# Patient Record
Sex: Female | Born: 1975 | Race: Black or African American | Hispanic: No | Smoking: Former smoker
Health system: Southern US, Community
[De-identification: ages and names within clinical notes are randomized; demographics above are authoritative.]

## PROBLEM LIST (undated history)

## (undated) DIAGNOSIS — R739 Hyperglycemia, unspecified: Secondary | ICD-10-CM

## (undated) DIAGNOSIS — R0789 Other chest pain: Secondary | ICD-10-CM

## (undated) DIAGNOSIS — K219 Gastro-esophageal reflux disease without esophagitis: Secondary | ICD-10-CM

## (undated) DIAGNOSIS — G510 Bell's palsy: Secondary | ICD-10-CM

## (undated) DIAGNOSIS — J383 Other diseases of vocal cords: Secondary | ICD-10-CM

## (undated) DIAGNOSIS — G40909 Epilepsy, unspecified, not intractable, without status epilepticus: Secondary | ICD-10-CM

## (undated) DIAGNOSIS — Z720 Tobacco use: Secondary | ICD-10-CM

## (undated) DIAGNOSIS — D649 Anemia, unspecified: Secondary | ICD-10-CM

## (undated) DIAGNOSIS — J0100 Acute maxillary sinusitis, unspecified: Secondary | ICD-10-CM

## (undated) HISTORY — DX: Hyperglycemia, unspecified: R73.9

## (undated) HISTORY — DX: Anemia, unspecified: D64.9

## (undated) HISTORY — DX: Morbid (severe) obesity due to excess calories: E66.01

## (undated) HISTORY — DX: Bell's palsy: G51.0

## (undated) HISTORY — DX: Acute maxillary sinusitis, unspecified: J01.00

## (undated) HISTORY — DX: Other diseases of vocal cords: J38.3

## (undated) HISTORY — DX: Other chest pain: R07.89

## (undated) HISTORY — DX: Tobacco use: Z72.0

## (undated) HISTORY — DX: Gastro-esophageal reflux disease without esophagitis: K21.9

## (undated) HISTORY — DX: Epilepsy, unspecified, not intractable, without status epilepticus: G40.909

---

## 1998-03-29 HISTORY — PX: TUBAL LIGATION: SHX77

## 2000-10-22 ENCOUNTER — Emergency Department (HOSPITAL_COMMUNITY): Admission: EM | Admit: 2000-10-22 | Discharge: 2000-10-22 | Payer: Self-pay | Admitting: *Deleted

## 2000-12-21 ENCOUNTER — Encounter: Payer: Self-pay | Admitting: Emergency Medicine

## 2000-12-21 ENCOUNTER — Emergency Department (HOSPITAL_COMMUNITY): Admission: EM | Admit: 2000-12-21 | Discharge: 2000-12-21 | Payer: Self-pay | Admitting: Emergency Medicine

## 2001-01-09 ENCOUNTER — Encounter: Payer: Self-pay | Admitting: Critical Care Medicine

## 2001-01-09 ENCOUNTER — Encounter: Payer: Self-pay | Admitting: Pulmonary Disease

## 2001-01-09 ENCOUNTER — Inpatient Hospital Stay (HOSPITAL_COMMUNITY): Admission: AD | Admit: 2001-01-09 | Discharge: 2001-01-11 | Payer: Self-pay | Admitting: Critical Care Medicine

## 2005-08-18 ENCOUNTER — Emergency Department (HOSPITAL_COMMUNITY): Admission: EM | Admit: 2005-08-18 | Discharge: 2005-08-18 | Payer: Self-pay | Admitting: Emergency Medicine

## 2005-10-04 ENCOUNTER — Inpatient Hospital Stay (HOSPITAL_COMMUNITY): Admission: EM | Admit: 2005-10-04 | Discharge: 2005-10-05 | Payer: Self-pay | Admitting: Emergency Medicine

## 2005-12-08 ENCOUNTER — Emergency Department (HOSPITAL_COMMUNITY): Admission: EM | Admit: 2005-12-08 | Discharge: 2005-12-08 | Payer: Self-pay | Admitting: Emergency Medicine

## 2006-01-13 ENCOUNTER — Emergency Department (HOSPITAL_COMMUNITY): Admission: EM | Admit: 2006-01-13 | Discharge: 2006-01-13 | Payer: Self-pay | Admitting: Emergency Medicine

## 2006-01-24 ENCOUNTER — Inpatient Hospital Stay (HOSPITAL_COMMUNITY): Admission: EM | Admit: 2006-01-24 | Discharge: 2006-01-29 | Payer: Self-pay | Admitting: Emergency Medicine

## 2006-02-10 ENCOUNTER — Emergency Department (HOSPITAL_COMMUNITY): Admission: EM | Admit: 2006-02-10 | Discharge: 2006-02-10 | Payer: Self-pay | Admitting: Emergency Medicine

## 2006-04-07 ENCOUNTER — Inpatient Hospital Stay (HOSPITAL_COMMUNITY): Admission: EM | Admit: 2006-04-07 | Discharge: 2006-04-11 | Payer: Self-pay | Admitting: Emergency Medicine

## 2006-06-18 ENCOUNTER — Emergency Department (HOSPITAL_COMMUNITY): Admission: EM | Admit: 2006-06-18 | Discharge: 2006-06-19 | Payer: Self-pay | Admitting: Emergency Medicine

## 2006-07-25 ENCOUNTER — Emergency Department (HOSPITAL_COMMUNITY): Admission: EM | Admit: 2006-07-25 | Discharge: 2006-07-25 | Payer: Self-pay | Admitting: Emergency Medicine

## 2006-08-21 ENCOUNTER — Emergency Department (HOSPITAL_COMMUNITY): Admission: EM | Admit: 2006-08-21 | Discharge: 2006-08-21 | Payer: Self-pay | Admitting: Emergency Medicine

## 2006-08-31 ENCOUNTER — Ambulatory Visit (HOSPITAL_COMMUNITY): Admission: RE | Admit: 2006-08-31 | Discharge: 2006-08-31 | Payer: Self-pay | Admitting: Family Medicine

## 2006-11-09 ENCOUNTER — Emergency Department (HOSPITAL_COMMUNITY): Admission: EM | Admit: 2006-11-09 | Discharge: 2006-11-10 | Payer: Self-pay | Admitting: Emergency Medicine

## 2006-12-13 ENCOUNTER — Emergency Department (HOSPITAL_COMMUNITY): Admission: EM | Admit: 2006-12-13 | Discharge: 2006-12-14 | Payer: Self-pay | Admitting: Emergency Medicine

## 2007-02-11 ENCOUNTER — Emergency Department (HOSPITAL_COMMUNITY): Admission: EM | Admit: 2007-02-11 | Discharge: 2007-02-11 | Payer: Self-pay | Admitting: Emergency Medicine

## 2007-06-19 ENCOUNTER — Ambulatory Visit (HOSPITAL_COMMUNITY): Admission: RE | Admit: 2007-06-19 | Discharge: 2007-06-19 | Payer: Self-pay | Admitting: Family Medicine

## 2007-07-20 ENCOUNTER — Inpatient Hospital Stay (HOSPITAL_COMMUNITY): Admission: EM | Admit: 2007-07-20 | Discharge: 2007-07-22 | Payer: Self-pay | Admitting: Emergency Medicine

## 2007-08-14 ENCOUNTER — Inpatient Hospital Stay (HOSPITAL_COMMUNITY): Admission: EM | Admit: 2007-08-14 | Discharge: 2007-08-17 | Payer: Self-pay | Admitting: Emergency Medicine

## 2007-09-29 ENCOUNTER — Emergency Department (HOSPITAL_COMMUNITY): Admission: EM | Admit: 2007-09-29 | Discharge: 2007-09-29 | Payer: Self-pay | Admitting: Emergency Medicine

## 2007-11-03 IMAGING — CR DG FOOT COMPLETE 3+V*R*
3 series · 3 of 3 positions shown · non-contrast
Comparison: None available.

CLINICAL DATA: Dropped a table on the right great toe.
 RIGHT FOOT ? 3 VIEW:

[view not recorded (1 of 3)]
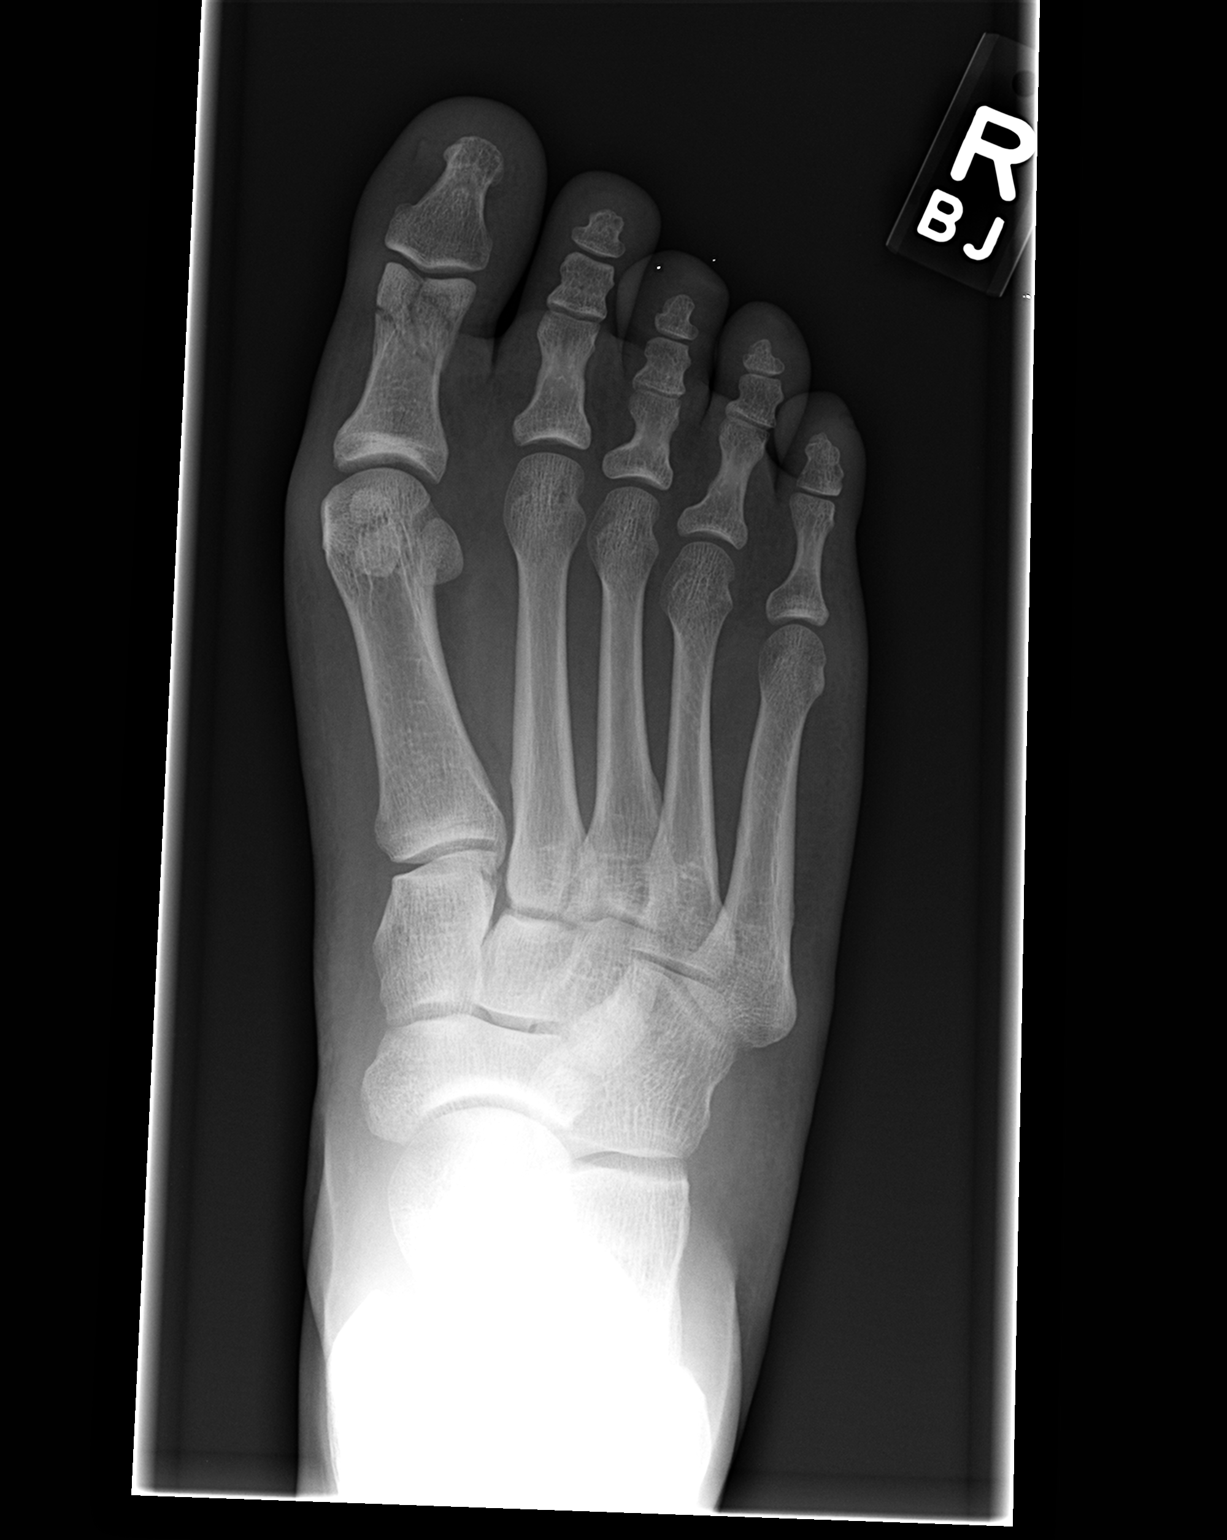

[view not recorded (2 of 3)]
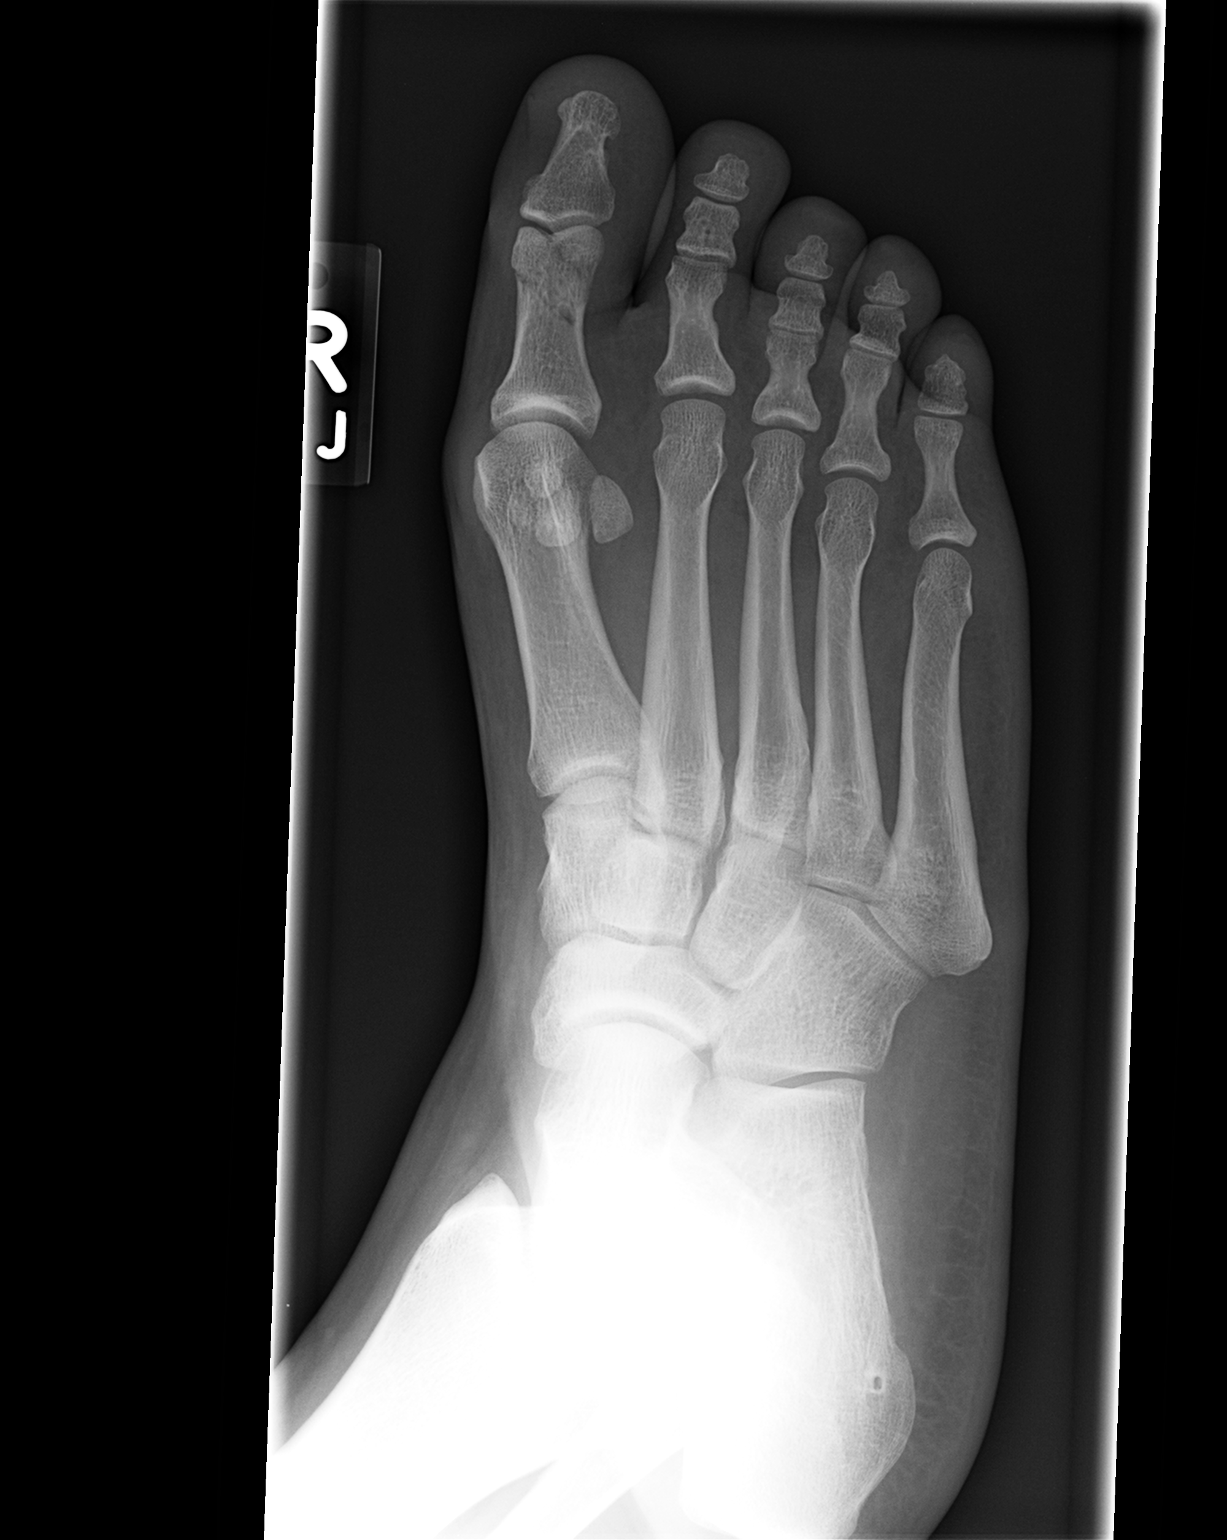

[view not recorded (3 of 3)]
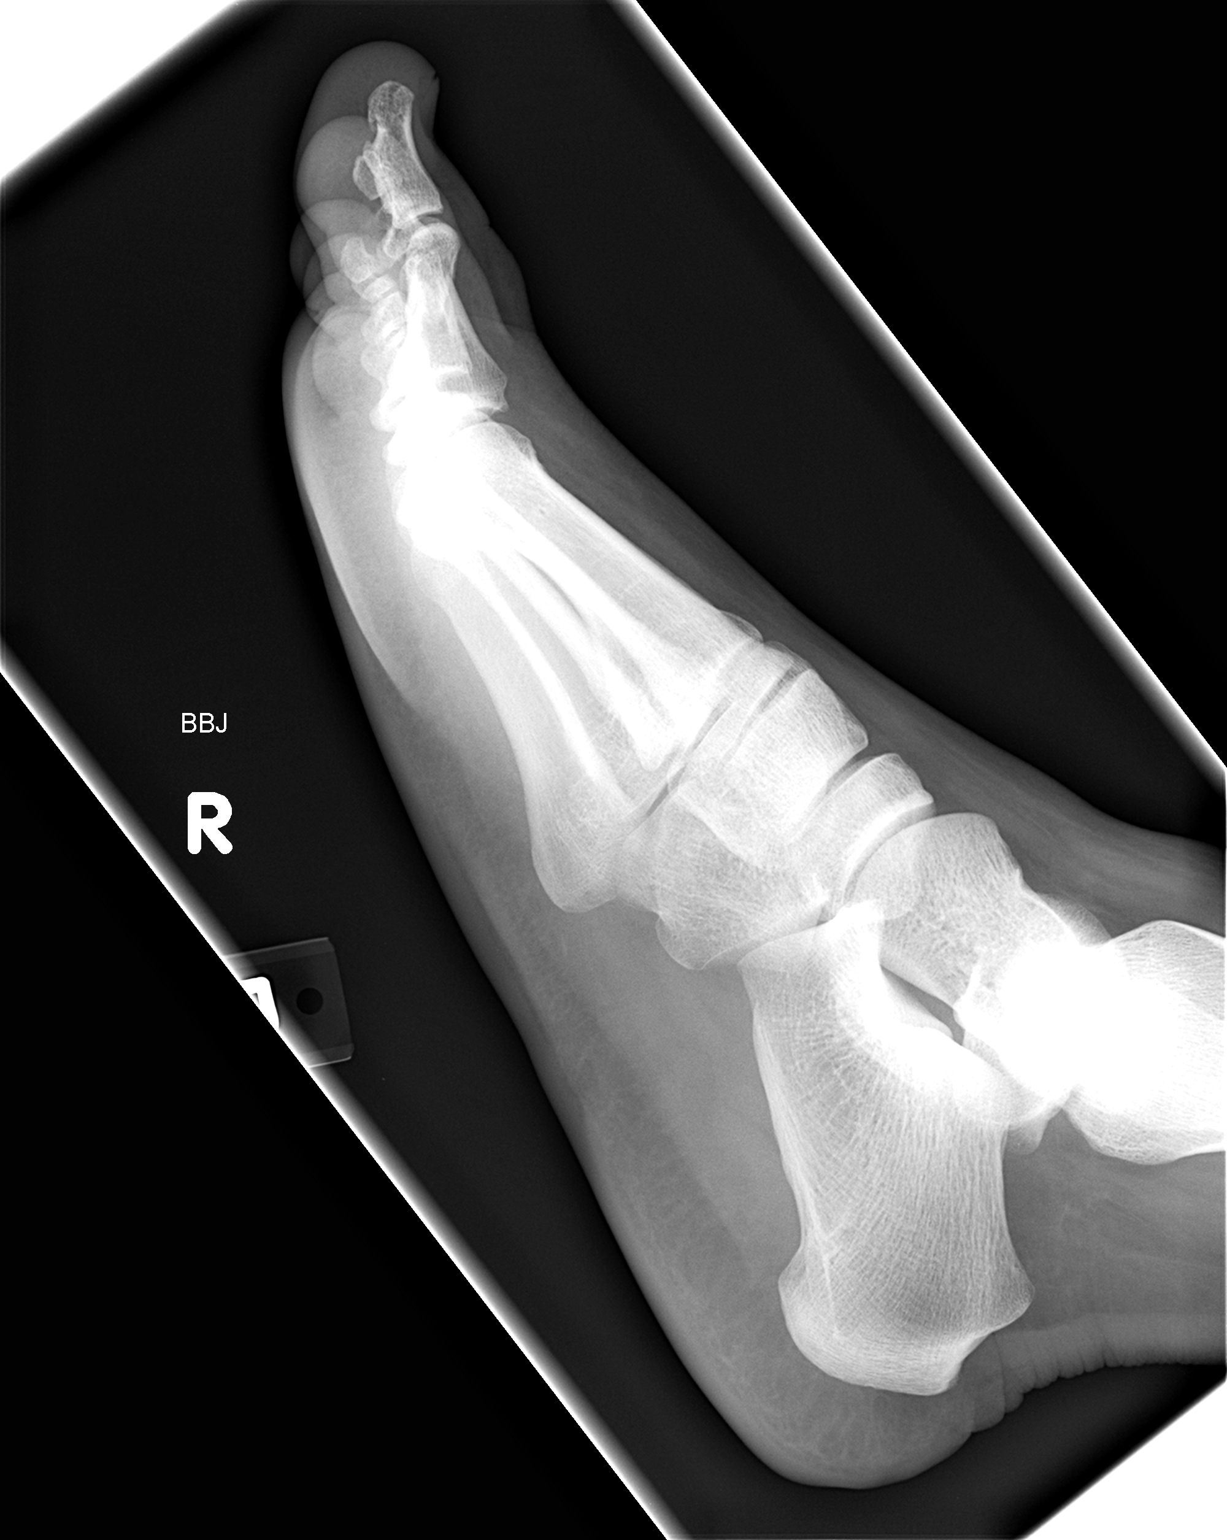

[3 of 3 positions shown; findings below may reference images not displayed]

FINDINGS: There is a comminuted fracture of the proximal phalanx of the great toe.  The fracture plane extends to the distal articular surface.  No other acute bony abnormality identified.
IMPRESSION: Comminuted fracture of the great toe proximal phalanx with extension to the distal articular surface.

## 2007-11-12 ENCOUNTER — Emergency Department (HOSPITAL_COMMUNITY): Admission: EM | Admit: 2007-11-12 | Discharge: 2007-11-12 | Payer: Self-pay | Admitting: Family Medicine

## 2008-02-18 ENCOUNTER — Emergency Department (HOSPITAL_COMMUNITY): Admission: EM | Admit: 2008-02-18 | Discharge: 2008-02-18 | Payer: Self-pay | Admitting: Emergency Medicine

## 2008-04-25 ENCOUNTER — Emergency Department (HOSPITAL_COMMUNITY): Admission: EM | Admit: 2008-04-25 | Discharge: 2008-04-26 | Payer: Self-pay | Admitting: Emergency Medicine

## 2008-04-27 IMAGING — CR DG WRIST COMPLETE 3+V*R*
2 series · 2 of 2 positions shown · non-contrast
Comparison: none

CLINICAL DATA: Wrestling, hyperextended wrist, swelling, stiffness. 
 RIGHT WRIST - 4 VIEW:

[view not recorded (1 of 2)]
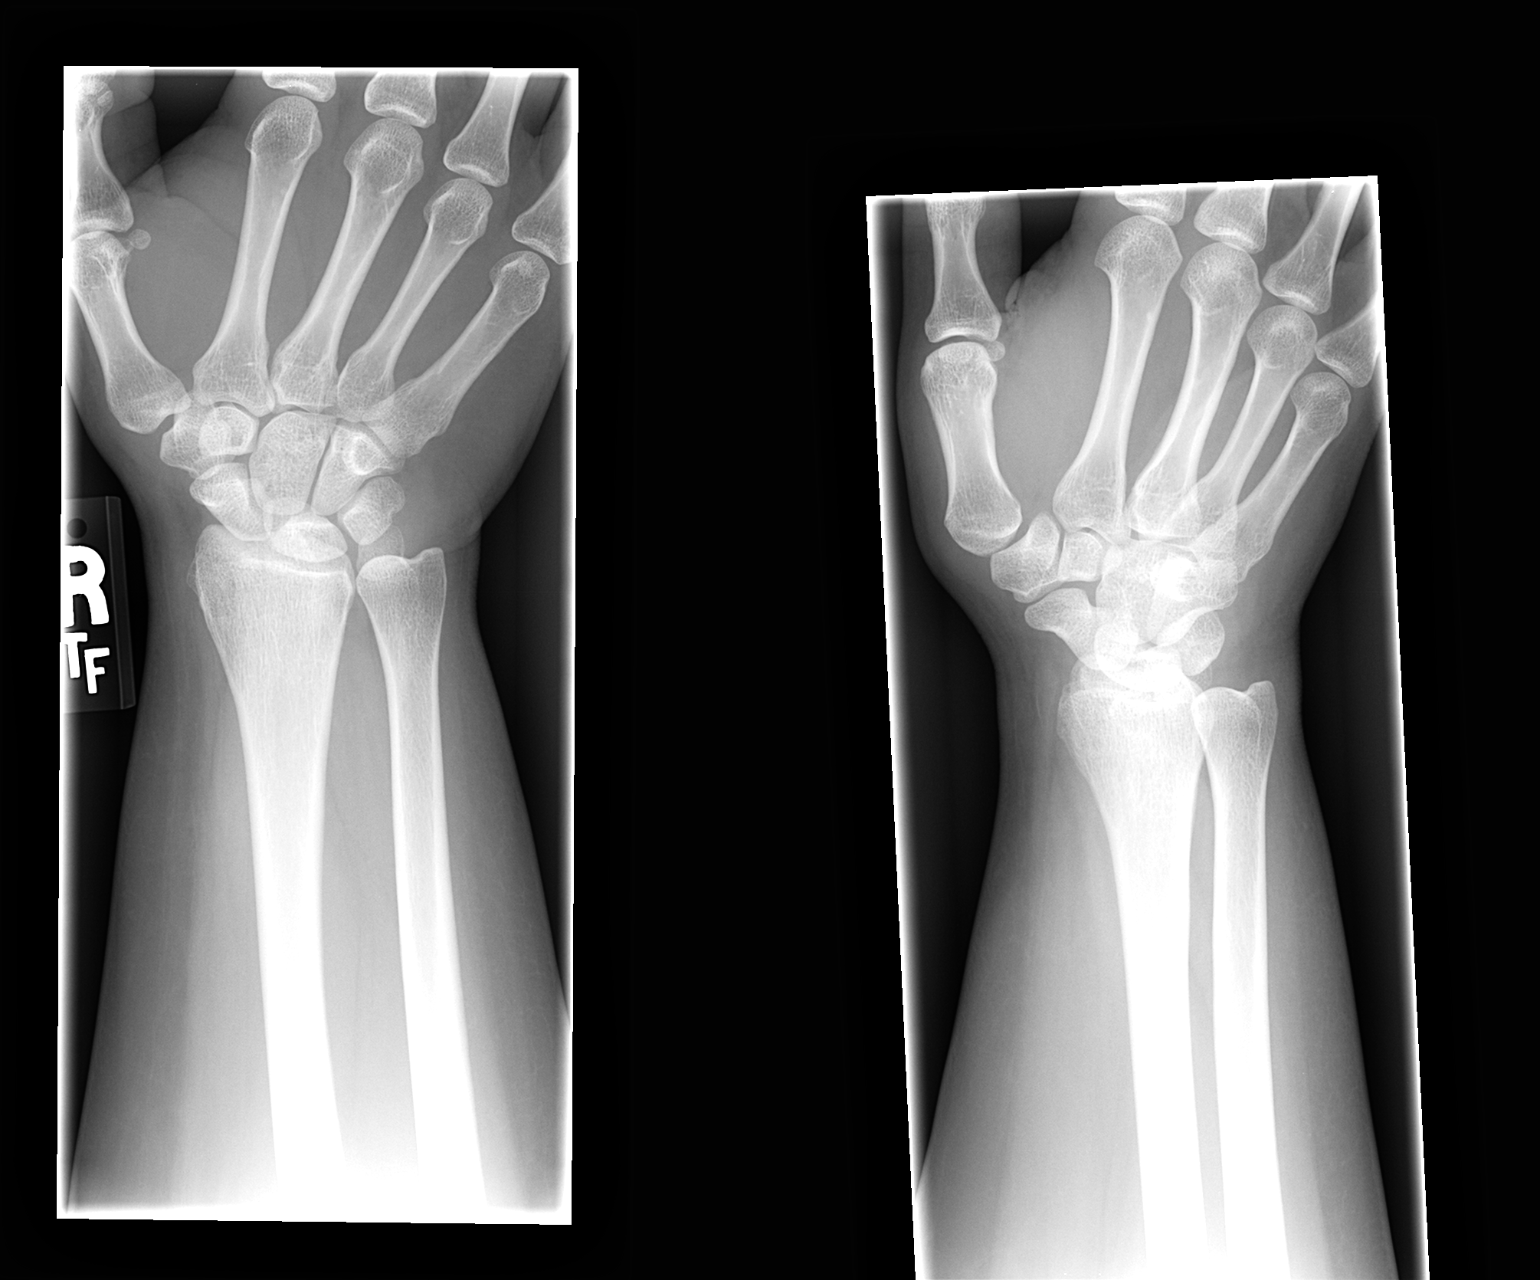

[view not recorded (2 of 2)]
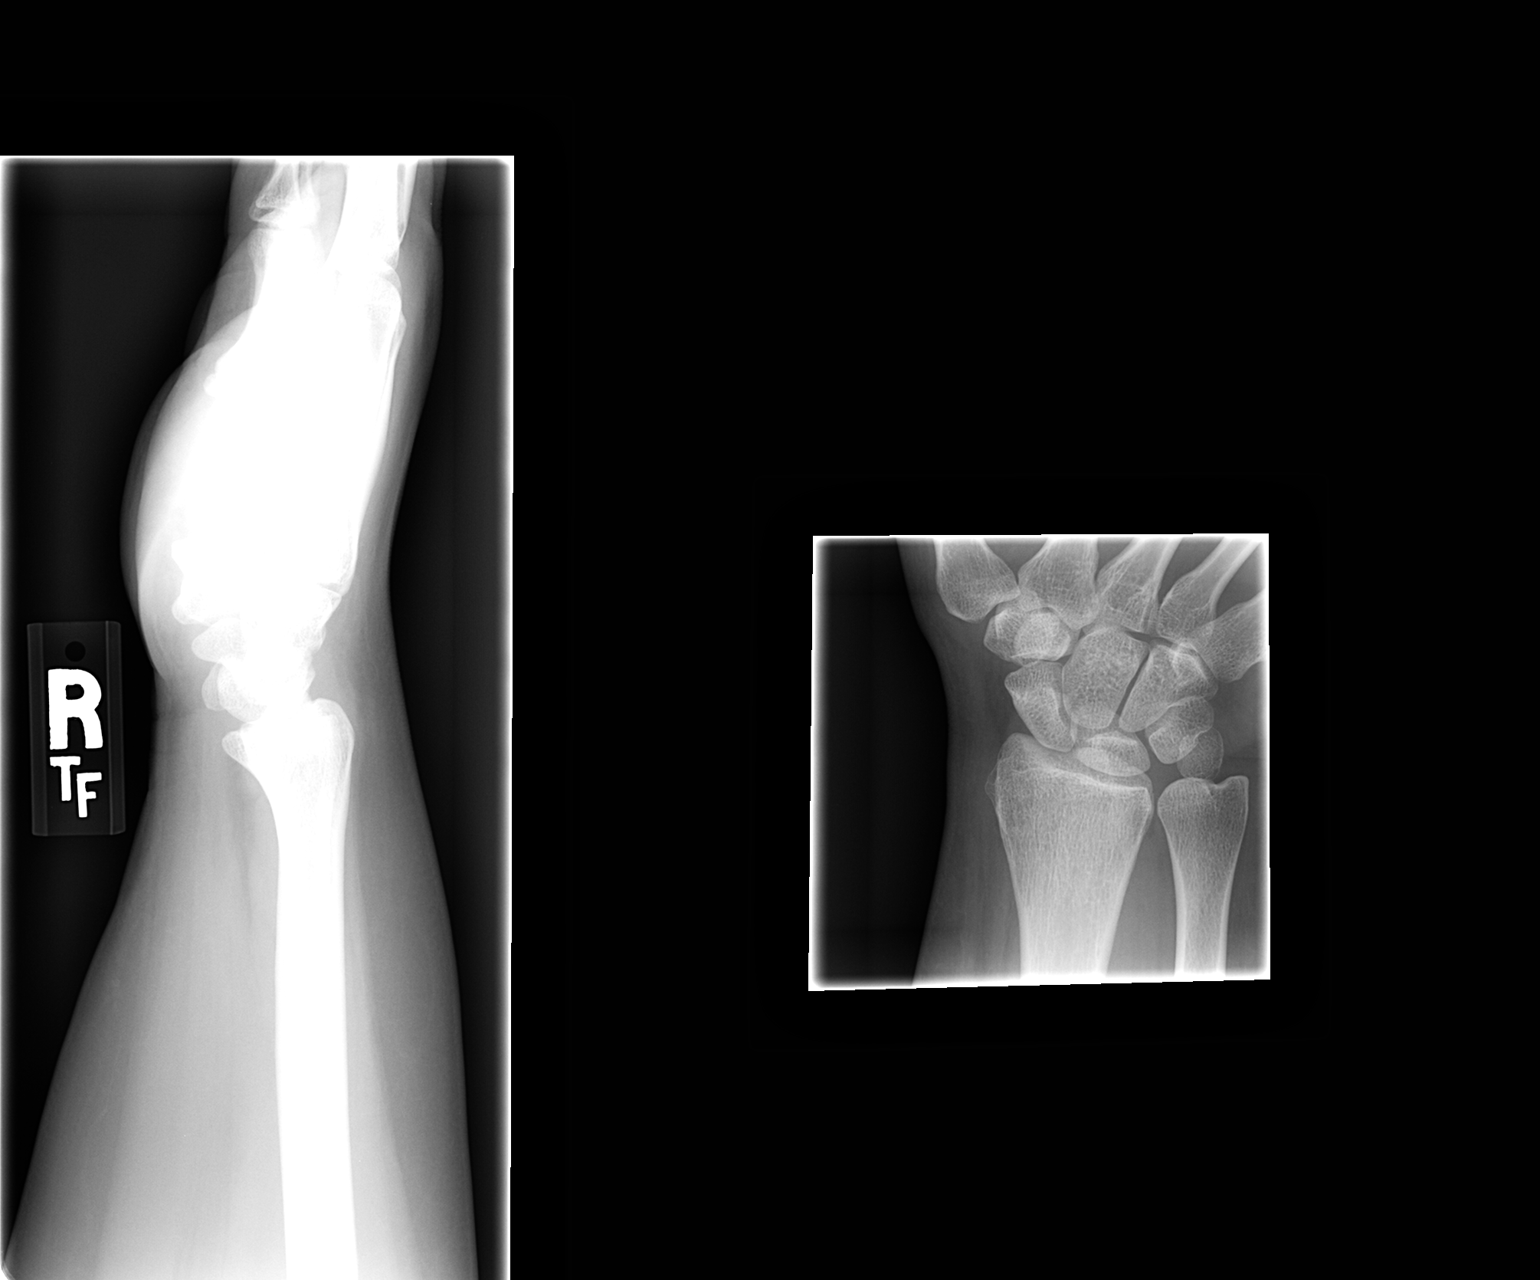

[2 of 2 positions shown; findings below may reference images not displayed]

FINDINGS: There is no evidence of fracture or dislocation.  There is no evidence of arthropathy or other focal bone abnormality.  Soft tissues are unremarkable.
IMPRESSION: Negative.

## 2008-10-26 ENCOUNTER — Emergency Department (HOSPITAL_COMMUNITY): Admission: EM | Admit: 2008-10-26 | Discharge: 2008-10-27 | Payer: Self-pay | Admitting: Emergency Medicine

## 2009-03-07 ENCOUNTER — Emergency Department (HOSPITAL_COMMUNITY): Admission: EM | Admit: 2009-03-07 | Discharge: 2009-03-07 | Payer: Self-pay | Admitting: Family Medicine

## 2009-04-01 ENCOUNTER — Emergency Department (HOSPITAL_COMMUNITY): Admission: EM | Admit: 2009-04-01 | Discharge: 2009-04-01 | Payer: Self-pay | Admitting: Emergency Medicine

## 2009-06-27 ENCOUNTER — Emergency Department (HOSPITAL_COMMUNITY): Admission: EM | Admit: 2009-06-27 | Discharge: 2009-06-28 | Payer: Self-pay | Admitting: Emergency Medicine

## 2009-11-13 ENCOUNTER — Ambulatory Visit (HOSPITAL_BASED_OUTPATIENT_CLINIC_OR_DEPARTMENT_OTHER): Admission: RE | Admit: 2009-11-13 | Discharge: 2009-11-13 | Payer: Self-pay | Admitting: Orthopedic Surgery

## 2009-12-03 ENCOUNTER — Ambulatory Visit (HOSPITAL_COMMUNITY): Admission: RE | Admit: 2009-12-03 | Discharge: 2009-12-03 | Payer: Self-pay | Admitting: Family Medicine

## 2010-01-09 ENCOUNTER — Emergency Department (HOSPITAL_COMMUNITY): Admission: EM | Admit: 2010-01-09 | Discharge: 2010-01-10 | Payer: Self-pay | Admitting: Emergency Medicine

## 2010-06-11 LAB — POCT HEMOGLOBIN-HEMACUE: Hemoglobin: 12.1 g/dL (ref 12.0–15.0)

## 2010-06-17 LAB — DIFFERENTIAL
Eosinophils Absolute: 0.1 10*3/uL (ref 0.0–0.7)
Eosinophils Relative: 1 % (ref 0–5)
Lymphocytes Relative: 28 % (ref 12–46)
Lymphs Abs: 2.1 10*3/uL (ref 0.7–4.0)
Monocytes Absolute: 0.2 10*3/uL (ref 0.1–1.0)
Monocytes Relative: 2 % — ABNORMAL LOW (ref 3–12)

## 2010-06-17 LAB — BASIC METABOLIC PANEL
CO2: 24 mEq/L (ref 19–32)
Chloride: 107 mEq/L (ref 96–112)
Potassium: 3.7 mEq/L (ref 3.5–5.1)
Sodium: 138 mEq/L (ref 135–145)

## 2010-06-17 LAB — CBC
HCT: 38.5 % (ref 36.0–46.0)
Hemoglobin: 12.5 g/dL (ref 12.0–15.0)
MCV: 81.3 fL (ref 78.0–100.0)
Platelets: 224 10*3/uL (ref 150–400)
RBC: 4.74 MIL/uL (ref 3.87–5.11)
WBC: 7.5 10*3/uL (ref 4.0–10.5)

## 2010-06-17 LAB — D-DIMER, QUANTITATIVE

## 2010-07-30 ENCOUNTER — Emergency Department (HOSPITAL_COMMUNITY): Payer: Medicare Other

## 2010-07-30 ENCOUNTER — Inpatient Hospital Stay (HOSPITAL_COMMUNITY)
Admission: EM | Admit: 2010-07-30 | Discharge: 2010-08-03 | DRG: 203 | Disposition: A | Discharge status: Home / Self Care | Payer: Medicare Other | Attending: Internal Medicine | Admitting: Internal Medicine

## 2010-07-30 DIAGNOSIS — G40909 Epilepsy, unspecified, not intractable, without status epilepticus: Secondary | ICD-10-CM | POA: Diagnosis present

## 2010-07-30 DIAGNOSIS — G4733 Obstructive sleep apnea (adult) (pediatric): Secondary | ICD-10-CM | POA: Diagnosis present

## 2010-07-30 DIAGNOSIS — R0789 Other chest pain: Secondary | ICD-10-CM | POA: Diagnosis present

## 2010-07-30 DIAGNOSIS — K219 Gastro-esophageal reflux disease without esophagitis: Secondary | ICD-10-CM | POA: Diagnosis present

## 2010-07-30 DIAGNOSIS — J45901 Unspecified asthma with (acute) exacerbation: Principal | ICD-10-CM | POA: Diagnosis present

## 2010-07-30 DIAGNOSIS — F17200 Nicotine dependence, unspecified, uncomplicated: Secondary | ICD-10-CM | POA: Diagnosis present

## 2010-07-30 DIAGNOSIS — Z91199 Patient's noncompliance with other medical treatment and regimen due to unspecified reason: Secondary | ICD-10-CM

## 2010-07-30 DIAGNOSIS — Z9119 Patient's noncompliance with other medical treatment and regimen: Secondary | ICD-10-CM

## 2010-07-30 DIAGNOSIS — G510 Bell's palsy: Secondary | ICD-10-CM | POA: Diagnosis not present

## 2010-07-30 DIAGNOSIS — J0100 Acute maxillary sinusitis, unspecified: Secondary | ICD-10-CM | POA: Diagnosis present

## 2010-07-30 DIAGNOSIS — J383 Other diseases of vocal cords: Secondary | ICD-10-CM | POA: Diagnosis present

## 2010-07-30 DIAGNOSIS — R7309 Other abnormal glucose: Secondary | ICD-10-CM | POA: Diagnosis present

## 2010-07-30 LAB — POCT I-STAT, CHEM 8
BUN: 7 mg/dL (ref 6–23)
Calcium, Ion: 1.14 mmol/L (ref 1.12–1.32)
Chloride: 106 mEq/L (ref 96–112)
Creatinine, Ser: 1 mg/dL (ref 0.4–1.2)

## 2010-07-31 ENCOUNTER — Inpatient Hospital Stay (HOSPITAL_COMMUNITY): Payer: Medicare Other

## 2010-07-31 DIAGNOSIS — J45901 Unspecified asthma with (acute) exacerbation: Secondary | ICD-10-CM

## 2010-07-31 LAB — CARDIAC PANEL(CRET KIN+CKTOT+MB+TROPI)
CK, MB: 0.8 ng/mL (ref 0.3–4.0)
Relative Index: 0.6 (ref 0.0–2.5)
Relative Index: 0.9 (ref 0.0–2.5)
Total CK: 134 U/L (ref 7–177)
Total CK: 148 U/L (ref 7–177)
Troponin I: <0.3 ng/mL (ref <0.30)
Troponin I: <0.3 ng/mL (ref <0.30)

## 2010-07-31 LAB — COMPREHENSIVE METABOLIC PANEL
Albumin: 3.6 g/dL (ref 3.5–5.2)
BUN: 7 mg/dL (ref 6–23)
Calcium: 9.3 mg/dL (ref 8.4–10.5)
Creatinine, Ser: 0.76 mg/dL (ref 0.4–1.2)
Glucose, Bld: 184 mg/dL — ABNORMAL HIGH (ref 70–99)
Potassium: 4.5 mEq/L (ref 3.5–5.1)
Total Protein: 7.9 g/dL (ref 6.0–8.3)

## 2010-07-31 LAB — DIFFERENTIAL
Basophils Absolute: 0 10*3/uL (ref 0.0–0.1)
Eosinophils Relative: 1 % (ref 0–5)
Eosinophils Relative: 0 % (ref 0–5)
Lymphocytes Relative: 7 % — ABNORMAL LOW (ref 12–46)
Lymphs Abs: 1.5 10*3/uL (ref 0.7–4.0)
Monocytes Absolute: 0.2 10*3/uL (ref 0.1–1.0)
Monocytes Absolute: 0.1 10*3/uL (ref 0.1–1.0)
Monocytes Relative: 2 % — ABNORMAL LOW (ref 3–12)
Monocytes Relative: 1 % — ABNORMAL LOW (ref 3–12)
Neutro Abs: 7.2 10*3/uL (ref 1.7–7.7)
Neutro Abs: 8.2 10*3/uL — ABNORMAL HIGH (ref 1.7–7.7)

## 2010-07-31 LAB — RAPID URINE DRUG SCREEN, HOSP PERFORMED
Amphetamines: NOT DETECTED
Barbiturates: NOT DETECTED
Cocaine: NOT DETECTED
Opiates: POSITIVE — AB
Tetrahydrocannabinol: NOT DETECTED

## 2010-07-31 LAB — GLUCOSE, CAPILLARY
Glucose-Capillary: 150 mg/dL — ABNORMAL HIGH (ref 70–99)
Glucose-Capillary: 213 mg/dL — ABNORMAL HIGH (ref 70–99)

## 2010-07-31 LAB — LIPID PANEL
Cholesterol: 143 mg/dL (ref 0–200)
HDL: 46 mg/dL (ref >39)
LDL Cholesterol: 82 mg/dL (ref 0–99)
Total CHOL/HDL Ratio: 3.1 RATIO
Triglycerides: 74 mg/dL (ref <150)
VLDL: 15 mg/dL (ref 0–40)

## 2010-07-31 LAB — EXPECTORATED SPUTUM ASSESSMENT W GRAM STAIN, RFLX TO RESP C

## 2010-07-31 LAB — CBC
HCT: 35.9 % — ABNORMAL LOW (ref 36.0–46.0)
HCT: 35.8 % — ABNORMAL LOW (ref 36.0–46.0)
Hemoglobin: 11.7 g/dL — ABNORMAL LOW (ref 12.0–15.0)
MCH: 26.4 pg (ref 26.0–34.0)
MCH: 26.6 pg (ref 26.0–34.0)
MCHC: 32.7 g/dL (ref 30.0–36.0)
MCV: 81.6 fL (ref 78.0–100.0)
MCV: 81.4 fL (ref 78.0–100.0)
RDW: 16.5 % — ABNORMAL HIGH (ref 11.5–15.5)
WBC: 9 10*3/uL (ref 4.0–10.5)

## 2010-08-01 ENCOUNTER — Inpatient Hospital Stay (HOSPITAL_COMMUNITY): Payer: Medicare Other

## 2010-08-01 DIAGNOSIS — J45901 Unspecified asthma with (acute) exacerbation: Secondary | ICD-10-CM

## 2010-08-01 DIAGNOSIS — E669 Obesity, unspecified: Secondary | ICD-10-CM

## 2010-08-01 DIAGNOSIS — G510 Bell's palsy: Secondary | ICD-10-CM

## 2010-08-01 DIAGNOSIS — J383 Other diseases of vocal cords: Secondary | ICD-10-CM

## 2010-08-01 LAB — GLUCOSE, CAPILLARY
Glucose-Capillary: 180 mg/dL — ABNORMAL HIGH (ref 70–99)
Glucose-Capillary: 225 mg/dL — ABNORMAL HIGH (ref 70–99)

## 2010-08-01 LAB — CBC
HCT: 37.6 % (ref 36.0–46.0)
Hemoglobin: 11.9 g/dL — ABNORMAL LOW (ref 12.0–15.0)
MCH: 26.2 pg (ref 26.0–34.0)
MCHC: 31.6 g/dL (ref 30.0–36.0)
RDW: 16.9 % — ABNORMAL HIGH (ref 11.5–15.5)

## 2010-08-01 LAB — HEMOGLOBIN A1C
Hgb A1c MFr Bld: 5.9 % — ABNORMAL HIGH (ref <5.7)
Mean Plasma Glucose: 123 mg/dL — ABNORMAL HIGH (ref <117)

## 2010-08-02 LAB — GLUCOSE, CAPILLARY
Glucose-Capillary: 104 mg/dL — ABNORMAL HIGH (ref 70–99)
Glucose-Capillary: 127 mg/dL — ABNORMAL HIGH (ref 70–99)

## 2010-08-03 LAB — GLUCOSE, CAPILLARY
Glucose-Capillary: 84 mg/dL (ref 70–99)
Glucose-Capillary: 79 mg/dL (ref 70–99)

## 2010-08-03 LAB — CULTURE, RESPIRATORY W GRAM STAIN

## 2010-08-05 NOTE — Consult Note (Signed)
NAME:  Erica Tate, Erica Tate              ACCOUNT NO.:  [id number]  MEDICAL RECORD NO.:  [id number]           PATIENT TYPE:  I  LOCATION:  1521                         FACILITY:  [facility]  PHYSICIAN:  Charlcie Cradle. Delford Field, MD, FCCPDATE OF BIRTH:  05-14-77  DATE OF CONSULTATION:  07/31/2010 DATE OF DISCHARGE:                                CONSULTATION   This has been done at the request of [facility].  REASON FOR CONSULTATION:  Shortness of breath, wheezing in a patient with known asthma.  HISTORY OF PRESENT ILLNESS:  Erica Tate is a 35 year old African- American female who carries a diagnosis of asthma since age 89.  She was O2 dependent from age 89 to 25.  She has multiple ER admits with a history of intubation in the past.  She states she has known gastroesophageal reflux disease but notes she is not on any medications for this.  Also she and her roommate report that she has symptoms of obstructive sleep apnea i.e. not breathing during the night and waking up coughing and choking which is consistent with gastroesophageal reflux disease.  Note, she does not take any treatment for her OSA, nor does she have a CPAP machine.  She has 8 to 10 acute exacerbations of her asthma per year.  These are usually heat and pollen related as triggers. Note that she is disabled secondary to her asthma and her seizure disorders.  She presents to [facility] Emergency Room with 3 days of increasing shortness of breath.  Note she had a recent cold with increased wheezing and nonproductive cough.  She is followed by Dr. Katharine Look in [place], [place].  She was seen in the hospital by Dr. Delford Field in 2002, he was a pulmonologist, but never followed up in the office.  She is positive for tobacco abuse.  She denies EtOH and/or drug abuse.  She does have positive gastroesophageal reflux disease and obstructive sleep apnea symptoms.  Pulmonary was asked to evaluate.  PAST  MEDICAL HISTORY: 1. Asthma diagnosed t age 89 for which she takes a ProAir MDI.  Note,     she has been O2 dependent in the past. 2. Gastroesophageal reflux disease without treatment. 3. Obstructive sleep apnea with symptoms without a sleep study. 4. Conjunctivitis. 5. Seizure disorder for which she takes Depakote and then she had a     subtherapeutic Depakote level. 6. Noncompliance.  SOCIAL HISTORY:  She continues to smoke and has smoked since teenager, 1 to 3 cigarettes a day.  She is disabled secondary to her breathing disorders.  FAMILY HISTORY:  Reviewed, is noncontributory at this point.  REVIEW OF SYMPTOMS:  It was taken extensively.  Please see history and physical exam.  Note that she is positive for reflux symptoms, postnasal drip and gastroesophageal reflux disease symptoms.  PHYSICAL EXAMINATION:  VITAL SIGNS:  Blood pressure is 135/82, heart rate is 89, respiratory rate is 18.  Her sats are 100% on 2 L.  Her temperature is 97.2. GENERAL:  She is a morbidly obese African-American female, in no acute distress at rest with a constant barking cough.  CHEST:  Expiratory wheeze again with a barking cough. HEENT:  There is no JVD appreciated.  No cervical adenopathy is appreciated.  Oropharynx is unremarkable. CARDIAC:  Heart sounds are regular.  Regular rate and rhythm. ABDOMEN:  Obese but positive bowel sounds. EXTREMITIES:  Warm and without edema.  LABORATORY DATA:  Hemoglobin 9.7, hematocrit 35.8, platelets of 205. WBC is normal at 8.9.  Sodium 137, potassium 4.5, chloride 101, CO2 is 20, BUN is 7, creatinine 0.70, glucose is 154.  Depakote level is noted to be 19.1.  Her chest x-ray shows suspected bronchitis.  IMPRESSION AND PLAN:  Acute exacerbation of asthma complicated by noncompliance, tobacco abuse, suspected obstructive sleep apnea, gastroesophageal reflux disease, plus or minus sinus infection as indicated by postnasal drip.  Therefore: 1. We will  discontinue her aspirin in a 35 year old female with known     asthma as a questionable trigger. 2. We will discontinue Ambien with suspected obstructive sleep apnea. 3. We will discontinue Vicodin again in suspected obstructive sleep     apnea. 4. We will add Protonix b.i.d. for suspected gastroesophageal reflux     disease. 5. We will do a limited CT of the sinuses to rule out sinus infection.     She has a history of sinus infections in the past. 6. We will do a urine drug screen for completeness. 7. We may consider CT of the chest to monitor pulmonary remodelling     and tract the pulmonary remodeling. 8. She needs a sleep study for evaluation for obstructive sleep apnea     and be treated with continuous positive airway pressure in the     future. 9. She needs a pulmonary followup. 10.We will add Atrovent for bronchodilation. 11.In the near future, she will be started on topical steroids for     maintenance in the future.  At this point she is on IV Solu-Medrol     and that should be sufficient at this time.  Pulmonary will follow     along.     Devra Dopp, MSN, ACNP   ______________________________ Charlcie Cradle. Delford Field, MD, FCCP    SM/MEDQ  D:  07/31/2010  T:  07/31/2010  Job:  [id number]  Electronically Signed by Devra Dopp MSN ACNP on 08/05/2010 08:49:15 AM Electronically Signed by Shan Levans MD FCCP on 08/05/2010 09:44:00 PM

## 2010-08-11 NOTE — H&P (Signed)
NAME:  Erica Tate, Erica Tate              ACCOUNT NO.:  [id number]   MEDICAL RECORD NO.:  [id number]          PATIENT TYPE:  INP   LOCATION:  0110                         [facility]AC[facility]   PHYSICIAN:  Eduard Clos, MDDATE OF BIRTH:  23-Mar-1976   DATE OF ADMISSION:  07/20/2007  DATE OF DISCHARGE:                              HISTORY & PHYSICAL   CHIEF COMPLAINT:  Shortness of breath.   HISTORY OF PRESENT ILLNESS:  A 35 year old female with known history of  bronchial asthma, seizure disorder, also had history of intubation for  bronchial asthma exacerbation, presented with shortness of breath. The  patient states that shortness of breath increased two days ago where she  gradually progressively got worse, and today stated that it was so heavy  that she decided to come to the ER. She also states that shortness of  breath is present even at rest and increased by exertion.  __________  she gets some chest discomfort. Chest discomfort is not otherwise  present. Chest discomfort is not related to any exertion. Chest  discomfort is mostly retrosternal, nonradiating, only present when she  takes deep breaths. The patient denies any dizziness, loss of  consciousness, weakness of the limbs, abdominal pain, nausea, vomiting,  diarrhea, fever, chills, cough of productive sputum. The patient was  found to have increasing wheezing and was admitted for further  management of her chronic asthma exacerbation. The patient in addition  also stated that over the past one month she has been out of her Advair  Diskus which she takes twice a day 500/50, also been out of Xopenex. She  takes the Depakote 250 mg twice a day and Dilantin 100 mg 4 times a day  rectally.   PAST MEDICAL HISTORY:  Bronchial asthma with history of intubation last  year.  History of seizure disorder, has been seizure free for the past  eight months.   PAST SURGICAL HISTORY:  Tubal ligation.   MEDICATIONS AT DISCHARGE:  The  patient has been not taking Advair Diskus  500/50 twice a day for the last 1 month and also has not been taking  Xopenex. Has been taking Depakote 250 twice a day and Dilantin 100 mg 4  times a day.   ALLERGIES:  PREDNISONE AND PENICILLIN. The patient does tolerate Solu-  Medrol.   FAMILY HISTORY:  Nothing contributory.   SOCIAL HISTORY:  The patient lives with a friend and kids. Smokes  cigarettes and has been advised strongly to quit smoking. Drinks alcohol  occasionally. Denies any drug abuse.   REVIEW OF SYSTEMS:  As in history of present illness, nothing else  significant.   PHYSICAL EXAMINATION:  Patient examined at bedside, appears mildly short  of breath. Is able to talk but has difficulty completing a sentence.  VITAL SIGNS:  Blood pressure is 129/80, pulse 104 per minute,  temperature 98.9, respirations 20 per minute, O2 saturation 100% on room  air.  HEENT:  Anicteric conjunctivae.  CHEST:  Bilateral air entry present with bilateral expiratory wheeze.  HEART:  S1/S2 heard.  ABDOMEN:  Soft, nontender. Bowel sounds heard. No guarding  or rigidity.  CENTRAL NERVOUS SYSTEM:  Alert and oriented to time, place and person.  Moves upper and lower extremities; peripheral pulses felt.  No edema.   LABORATORY DATA:  Chest x-ray shows nothing acute. Comprehensive  metabolic panel, cardiac enzymes, EKG, PT, INR, and CBC will be ordered  now.   ASSESSMENT:  1. Acute exacerbation of bronchial asthma.  2. History of seizure disorder.  3. History of smoking.   PLAN:  Admit patient to telemetry floor. Will continue patient on IV  steroids antibiotics and will follow the labs which are being ordered  now including comprehensive metabolic panel, CBC, and pregnancy screen.  Will also follow cardiac enzymes. Advised patient to quit smoking.  Further recommendations as the patient's condition evolves.      Eduard Clos, MD  Electronically Signed     ANK/MEDQ  D:   07/20/2007  T:  07/20/2007  Job:  [id number]

## 2010-08-11 NOTE — Discharge Summary (Signed)
NAME:  Erica Tate, Erica Tate              ACCOUNT NO.:  [id number]   MEDICAL RECORD NO.:  [id number]          PATIENT TYPE:  INP   LOCATION:  1343                         [facility]ACILITY[facility]   PHYSICIAN:  Herbie Saxon, MDDATE OF BIRTH:  [birth year] 1977   DATE OF ADMISSION:  08/14/2007  DATE OF DISCHARGE:  08/17/2007                               DISCHARGE SUMMARY   DISCHARGE DIAGNOSES:  1. Bronchial asthma exacerbation, improved.  2. Obesity.  3. Tobacco abuse.  4. Left eye conjunctivitis, improving.  5. History of migraine.  6. History of seizure.  7. Medical noncompliance.  8. Sinus tachycardia, improved.  9. Suboptimal Dilantin level, improved.   HISTORY OF PRESENT ILLNESS:  This 35 year old African American lady  presented to the emergency room of [facility] with two day  history of shortness of breath and wheezing.  The patient is poorly  compliant with her home medications for asthma.   HOSPITAL COURSE:  The patient was started on IV Solu-Medrol, Xopenex and  Atrovent inhalers.  Gentle IV fluid hydration.  Counseled on tobacco  cessation.  Placed on a nicotine patch.  Her Dilantin and Depakote were  continued.  The patient did improve with reduced wheezing and improved  air exchange.  She persisted in having left eye redness and burning with  clear discharge on Aug 15, 2007.  She was started on TobraDex eye drops  and this is improved.   CONDITION ON DISCHARGE:  Stable.   DIET:  Low-fat diet.   ACTIVITY:  No restrictions.   FOLLOWUP:  With her primary care physician, Dr. Mila Homer. Sudie Bailey, in  three to five days.  He will reinforce the tobacco cessation.   DISCHARGE MEDICATIONS:  1. Advair 500/50 mg, one puff twice daily.  2. Depakote 250 mg twice daily.  3. Nicotine patch 17 mg daily.  4. Dilantin 400 mg q.h.s.  5. TobraDex eye drops, two drops in left eye q.6h. for the next three      days.  6. Prednisone taper 40 mg daily for four days, 30 mg daily for  four      days, 20 mg for four days, 10 mg daily for two days and stop.  This      will be reviewed by her primary care physician.  7. Continue the Depakote 250 mg twice daily.  8. Continue with two puffs q.4-6h. inhaler p.r.n.   DISCHARGE PHYSICAL EXAMINATION:  GENERAL:  Today she is a young lady,  not in acute distress.  VITAL SIGNS:  Temperature 98 degrees, pulse 82, respirations 18, blood  pressure 134/82.  HEENT:  Pupils equal, reactive to light and accommodation.  Extraocular  muscles intact.  Reduced left eye erythema and discharge.  Oropharynx  and nasopharynx clear.  NECK:  Supple.  No elevated jugular venous distention.  CHEST:  Clinically clear.  No rales, no rhonchi, no wheeze.  No rubs.  HEART:  Sounds 1 and 2 regular.  ABDOMEN:  Truncal obesity, soft, nontender.  No organomegaly, no masses.  Inguinal orifices are patent.  NEUROLOGIC:  She is alert and oriented to time, place  and person.  Sensation normal.  Cranial nerves II-XII  intact.  EXTREMITIES:  Peripheral pulses with no pedal edema.  Deep tendon  reflexes  reduced in all limbs.   LABORATORY DATA:  Dilantin level 9.7.  WBCs 8, hematocrit 38, platelet  count 260.  Chemistry shows sodium 139, potassium 3.9, chloride 106, BUN  9, creatinine 1.1, glucose 99.   RADIOLOGY:  A chest x-ray of Aug 14, 2007, shows no active  cardiopulmonary disease.   This discharge required 30 minutes.      Herbie Saxon, MD  Electronically Signed     MIO/MEDQ  D:  08/17/2007  T:  08/17/2007  Job:  [id number]   cc:   Mila Homer. Sudie Bailey, M.D.  Fax: [phone]

## 2010-08-11 NOTE — H&P (Signed)
NAME:  GHADA, ABBETT              ACCOUNT NO.:  [id number]   MEDICAL RECORD NO.:  [id number]          PATIENT TYPE:  EMS   LOCATION:  ED                           FACILITY:  [facility]   PHYSICIAN:  Mobolaji B. Bakare, M.D.DATE OF BIRTH:  10/04/1975   DATE OF ADMISSION:  08/14/2007  DATE OF DISCHARGE:                              HISTORY & PHYSICAL   PRIMARY CARE PHYSICIAN:  Mila Homer. Sudie Bailey, M.D., [place] Ace.   CHIEF COMPLAINT:  Shortness of breath.   HISTORY OF PRESENT ILLNESS:  Ms. Devos is a pleasant 35 year old  African American female with history of asthma.  She was recently  hospitalized in April and treated for asthma exacerbation.  She was well  prior to discharge on July 22, 2007.  Two days ago, she developed  shortness of breath.  She has been using ProAir inhaler and Advair  Diskus without much improvement.  She presented to the emergency room  with wheezing.  She has received Xopenex and Atrovent inhaler and was  given nebulization, IV Solu-Medrol 125 mg.  She is not making  appreciable improvement, hence, [facility] was asked to admit for  stabilization and further management.  Of note is that the patient has  history of intubation in February of 2008 secondary to asthma  exacerbation.  She only recently started Advair Diskus about a month  ago, and she states that she has been compliant with her medication.   REVIEW OF SYSTEMS:  She denies fever, chills.  Cough is nonproductive of  sputum.  There is no headache.  No nausea, vomiting, diarrhea, abdominal  pain.  She denies dysuria or urgency or increased frequency of  micturition.   PAST MEDICAL HISTORY:  1. Asthma.  2. Asthma exacerbation with ICU admission and intubation in February      of 2009.  3. Seizure disorder.  4. Obesity.  5. Ongoing tobacco abuse.   PAST SURGICAL HISTORY:  Tubal ligation.   CURRENT MEDICATIONS:  1. Dilantin 100 mg p.o. 4 times a day.  2. Depakote 250 mg p.o. b.i.d.  3. ProAir  inhaler p.r.n.  4. Advair Diskus 500/50 1 puff b.i.d.  5. She recently completed prednisolone taper.   ALLERGIES:  PREDNISONE CAUSES RASH (SHE CAN TOLERATE PREDNISOLONE),  PENICILLIN CAUSES RASH.   FAMILY HISTORY:  Father passed away from homicide.  Mother has a lot of  health issues.   SOCIAL HISTORY:  The patient is unemployed.  She is on disability  secondary to asthma.  She smokes cigarettes 1-2 sticks a day.  Occasionally drinks alcohol.   PHYSICAL EXAMINATION:  VITAL SIGNS:  Initial vitals revealed a  temperature of 97.6, blood pressure 148/82, pulse of 114, respiratory  rate 31 initially and now 20,  O2 saturation of 100%.  GENERAL:  The  patient is awake, alert, oriented to time, place and person.  HEENT:  Normocephalic, atraumatic head.  LUNGS:  There is audible wheezing.  Not grossly using accessory muscles  of respiration.  Bilateral expiratory wheezes.  No crackles.  NECK:  No elevated JVD.  CVS:  S1-S2 regular.  ABDOMEN:  Nondistended, soft, nontender.  No palpable organomegaly.  Obese.  Bowel sounds present.  EXTREMITIES:  No pedal edema or calf tenderness.  Dorsalis pedis pulses  palpable bilaterally.  SKIN:  No rash or petechiae.   LABORATORY DATA:  Initial laboratory data revealed a sodium of 139,  potassium 3.9, chloride 106, glucose 99, BUN 9, creatinine 1.1.  Calcium  1.13.   Chest x-ray shows no active cardiopulmonary disease.   ASSESSMENT AND PLAN:  Ms. Straub is a 35 year old African American  female with history of asthma.  She is on disability secondary to  chronic asthma.  She has history of intensive care unit admission and  intubation in February of 2008.  She is presenting with shortness of  breath and received nebulization and intravenous steroids in the  emergency room which lead to improvement.  She is slightly tachycardic  and has audible wheezing.  She will be admitted for further  stabilization to the step-down unit.   ADMISSION  DIAGNOSES:  1. Asthma exacerbation.  Continue with albuterol nebulizers q.4h. and      q.2h. p.r.n., IV Solu-Medrol 60 mg q.6h.  IV fluids with normal      saline 60 mL/hour.  Once again strongly advised to quit smoking.      We will check pre and post nebulizer peak expiratory flow rate.      Will continue with Advair 500/50 one b.i.d.  2. History of seizure disorder.  Will continue Dilantin and Keppra.  3. Ongoing tobacco abuse.  Nicotine patch 7 mg daily.  Will offer      tobacco cessation counseling.  4. Obesity.      Mobolaji B. Corky Downs, M.D.  Electronically Signed     MBB/MEDQ  D:  08/14/2007  T:  08/14/2007  Job:  [id number]   cc:   Mila Homer. Sudie Bailey, M.D.  Fax: [phone]

## 2010-08-11 NOTE — Discharge Summary (Signed)
NAME:  Erica Tate, Erica Tate              ACCOUNT NO.:  [id number]   MEDICAL RECORD NO.:  [id number]          PATIENT TYPE:  INP   LOCATION:  1506                         [facility]ACILITY[facility]   PHYSICIAN:  Eduard Clos, MDDATE OF BIRTH:  July 17, 1975   DATE OF ADMISSION:  07/20/2007  DATE OF DISCHARGE:  07/22/2007                               DISCHARGE SUMMARY   HOSPITAL COURSE:  A 35 year old female with known history of bronchial  asthma, smoking cigarette smoking, seizure disorder presented with  shortness of breath on admission.  The patient had a chest x-ray which  did not show any acute findings.  The patient was admitted to the  medical floor and started on IV steroids and bronchodilators and empiric  antibiotics.  The patient states she was not taking her Advair Diskus  and Xopenex for the last 1 month.  Once the patient's condition got  better, the patient was transferred to a non-telemetry floor.  At this  time, as the patient's condition improved, the patient was eager to go  home.  The patient will be discharged home and advised to follow up  within 2 days to follow up with her primary care physician.  At the time  of discharge, the patient is hemodynamically stable.   DISCHARGE DIAGNOSES:  1. Acute exacerbation of bronchial asthma.  2. Seizure disorder.  3. Cigarette smoking.   DISCHARGE MEDICATIONS:  1. Dilantin 100 mg p.o. 4 times day.  2. Depakote 250 mg p.o. twice daily.  3. Xopenex nebulizer q.6h. p.r.n.  4. Advair Diskus 500/50 one puff twice daily.  5. Prednisolone 20 mg p.o. daily for 3 days following by 10 mg p.o.      daily for 3 days followed by 5 mg p.o. daily for 3 days and then      stop.   PLAN:  Patient advised to follow up with her primary care physician on  July 24, 2007, to be compliant with her medications.  No driving until  cleared by her primary care physician.  Strongly advised to be compliant  with medications and advised to quit  smoking.      Eduard Clos, MD  Electronically Signed     ANK/MEDQ  D:  07/22/2007  T:  07/22/2007  Job:  [id number]

## 2010-08-12 NOTE — Discharge Summary (Signed)
NAME:  Erica Tate, Erica Tate              ACCOUNT NO.:  [id number]  MEDICAL RECORD NO.:  [id number]           PATIENT TYPE:  I  LOCATION:  1521                         FACILITY:  [facility]  PHYSICIAN:  Erica Scott, MD     DATE OF BIRTH:  04-11-1975  DATE OF ADMISSION:  07/30/2010 DATE OF DISCHARGE:  08/03/2010                              DISCHARGE SUMMARY   PRIMARY CARE PHYSICIAN:  Erica Homer. Sudie Bailey, MD  DISCHARGE DIAGNOSES: 1. Vocal cord dysfunction plus or minus asthma with acute     exacerbation. 2. 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 3. Acute right maxillary sinusitis. 4. Tobacco abuse. 5. History of seizure disorder. 6. Anemia. 7. Gastroesophageal reflux disease. 8. Rule out obstructive sleep apnea. 9. Morbid obesity. 10.Question noncompliance. 11.Hyperglycemia without a diagnosis of diabetes. 12.Atypical chest pain, resolved.  DISCHARGE MEDICATIONS: 1. Artificial Tears 2 drops to the right eye p.r.n. 2. Symbicort 160/4.5 mcg inhaler, 2 puffs inhale b.i.d. 3. Avelox 400 mg p.o. daily for an additional 6 days to complete a 10-     day course. 4. Nicotine 7 mg per 24-hour patch transdermally daily. 5. Protonix 40 mg p.o. b.i.d. 6. Albuterol inhaler 1-2 puffs inhale q.4h. p.r.n. for difficulty     breathing or wheezing. 7. Depakote 500 mg p.o. b.i.d.  DISCONTINUED MEDICATIONS:  TheraFlu.  IMAGING: 1. A CT of the head without contrast, May 5, impression, no acute or     focal intracranial findings.  Acute right maxillary sinusitis. 2. CT maxillofacial without contrast, impression:     a.     Acute right maxillary sinusitis.     b.     Chronic sinusitis involving the left maxillary, bilateral      ethmoid and right frontal sinuses. 3. Chest x-ray on May 4, impression, coarsened lower lobe bronchial     markings suggest bronchitis, edema or pneumonia. 4. Chest x-ray, May 3, impression bronchial thickening.  PERTINENT LABS:  Sputum culture shows normal  oropharyngeal flora. Hemoglobin A1c 5.9.  CBC, hemoglobin 11.9, hematocrit 38, MCV 83, white blood cells 17, platelets 242.  Urine drug screen positive for opiates but otherwise negative.  Cardiac enzymes were cycled and negative for acute coronary syndrome.  Valproic acid is 19.1.  Lipid panel within normal limits.  Comprehensive metabolic panel only significant for glucose 184, otherwise within normal limits.  Coagulation indices within normal limits.  CONSULTATIONS: 1. Neurology, Dr. Levie Tate 2. Pulmonary critical care, Dr. Shan Tate.  DIET:  Heart healthy diet.  ACTIVITY:  As tolerated.  COMPLAINTS TODAY:  None.  She indicates that the weakness of her right side face is improving.  She denies dyspnea or chest pain.  PHYSICAL EXAMINATION:  GENERAL:  Erica Tate is in no obvious. VITAL SIGNS:  Temperature 97.8 degrees Fahrenheit; pulse 77 per minute, regular; respirations 20 per minute; blood pressure 115/76 mmHg and saturating at 100% on room air. RESPIRATORY:  Clear.  No increased work of breathing. CARDIOVASCULAR:  First and second heart sounds heard, regular. ABDOMEN:  Obese, nontender, soft and bowel sounds present. CENTRAL NERVOUS SYSTEM:  Patient is awake, alert, oriented x3.  Right  lower motor neuron facial palsy, but it looks to be improving compared to yesterday. EXTREMITIES:  With grade 5/5 power.  HOSPITAL COURSE:  Erica Tate is a 35 year old African American female patient with history of asthma since age 23 and was apparently oxygen- dependent from age 45 to 14.  She has had multiple ED visits with history of intubation in the past.  She states she has known gastroesophageal reflux disease but not on any medications.  She has symptoms of apnea and snoring at night but has not had a formal sleep study done.  She has history of seizures and apparently her Dilantin was discontinued more than a year ago and has been on valproate for approximately that time.   She has had 1 seizure in the last 12 months and the last one 4 months ago.  She continues to smoke cigarettes.  She now presented with 3 days history of worsening dyspnea and a recent cold.  1. Vocal cord dysfunction plus or minus asthma exacerbation.  The     patient was initially assessed to have acute bronchial asthma     exacerbation.  She was placed on IV Avelox and IV Solu-Medrol,     oxygen and bronchodilator nebulizations.  Her cardiac enzymes were     cycled and negative.  Her chest pain was not typical for cardiac     etiology.  The chest pain promptly resolved.  However, when this     dictator spoke with the patient, it was suggestive that the patient     had poorly controlled asthma with multiple flares and previous     history of intubations.  There were other symptoms suggestive of     sleep apnea, gastroesophageal reflux disease.  Pulmonary MDs were     thereby consulted.  They indicated that her presentation was mostly     secondary to vocal cord dysfunction with possible component of     bronchial asthma.  They recommended discontinuing her Advair     given the irritation of the powdered form of the upper airways,     quit smoking, outpatient evaluation for her sleep apnea, Symbicort,     treating her with proton pump inhibitors for gastroesophageal     reflux disease.  With this measures, the patient has made steady     improvement.  She indicates that she is allergic to prednisone     which causes tongue swelling.  So, after the Solu-Medrol, she was     not started on any oral steroids and has done well. 2. Right Bell's palsy.  A couple of nights ago, the patient presented     with sudden onset of weakness on the right side of the face.     Although a stroke code was initially called, it turned out to be     that the patient has Bell's palsy.  Neurology consulted.  They     indicated that the steroids only help in shortening the course of     the event and does not  really change outcome.  Given her history of     questionable prednisone allergy, no steroids were started.  Her     facial weakness is already starting to improve.  She has been     advised to get eye patch for the right eye when she is asleep and     use Artificial Tears liberally p.r.n.  She verbalizes     understanding. 3. Tobacco abuse.  Cessation  counseling done and nicotine patch     provided. 4. Gastroesophageal reflux disease.  B.i.d. PPIs. 5. History of seizure disorder.  The patient is continued on her home     Depakote. 6. Normocytic anemia.  Outpatient evaluation as deemed necessary. 7. Hyperglycemia.  The patient is advised to diet and lose weight as     outpatient.  DISPOSITION:  The patient is discharged home in stable condition.  FOLLOW-UP RECOMMENDATIONS: 1. Dr. Kalman Erica.  The patient has appointment to be seen on     May 25 at 10:45 a.m. 2. With Dr. John Giovanni.  The patient is to call for an     appointment to be seen in 7 to 10 days.  Time taken in coordinating this discharge is 30 minutes.     Erica Scott, MD     AH/MEDQ  D:  08/03/2010  T:  08/03/2010  Job:  [id number]  cc:   Erica Tate, M.D. Fax: [phone]  Kalman Shan, MD [address] [place] [address]  Electronically Signed by Erica Scott MD on 08/12/2010 10:32:55 PM

## 2010-08-14 NOTE — Discharge Summary (Signed)
NAME:  Erica Tate, Erica Tate              ACCOUNT NO.:  [id number]   MEDICAL RECORD NO.:  [id number]          PATIENT TYPE:  INP   LOCATION:  A220                          FACILITY:  [facility]   PHYSICIAN:  Osvaldo Shipper, MD     DATE OF BIRTH:  [birth year]   DATE OF ADMISSION:  10/04/2005  DATE OF DISCHARGE:  07/10/2007LH                                 DISCHARGE SUMMARY   The patient would like to set up Dr. Sudie Bailey as a PMD, which we have  encouraged her to do so.   Please review H and P dictated by Dr. Sherle Poe for details regarding the  patient's presenting illness.   DISCHARGE DIAGNOSES:  1.  Acute asthma exacerbation, improved.  2.  History of asthma since childhood.  3.  Obesity.  4.  Tobacco abuse.  5.  Medication noncompliance.  6.  History of seizure disorder.   BRIEF HOSPITAL COURSE:  Briefly, this is a 35 year old, African-American  female who has a history of asthma and seizure disorder, who presented to  the ED with shortness of breath.  She was found to be in acute asthma  exacerbation.  She was admitted to the hospital, put on nebulizer treatments  and Advair.  She mentioned that she was allergic to steroid products,  however, she was put on Solu-Medrol to which she did not have any adverse  effect.  She later clarified that she cannot tolerate any oral steroid  medications.   Over the course of one day, the patient's symptoms significantly improved.  She was able to breathe easier.  She, later on, mentioned to me that she has  not been taking any of her medications since January of this year.  This is  the most likely reason for her acute exacerbation.  She was told that she  needed to take Advair on a daily basis to avoid such exacerbations.  She was  also asked to take Xopenex only on an as needed basis.  She mentioned that  she was on home O2 at home.  She uses 2 liters per minute.  Her seizure  disorder appeared to be stable.   On the day of discharge the patient  was very keen to go home.  Her lungs  sounded improved.  There were scattered wheezes heard.  She was able to  ambulate with no difficulties.  Since she had home O2, we did not really  check her room air saturations.  Considering all the above, she was  considered stable for discharge.  It was noted that her Dilantin level was  subtherapeutic, hence she was given a p.o. loading dose today.   DISCHARGE MEDICATIONS:  1.  Advair 500/50 inhaled b.i.d.  2.  Xopenex nebulizers every 4 hours as needed.  3.  Dilantin.  She was given 400 mg in the hospital prior to discharge, and      she was asked to take 300 mg at 6:00 p.m. and then 300 mg at 9:00 p.m.      She was asked to resume her Dilantin 100 mg t.i.d., starting October 06, 2005.   FOLLOW UP:  The patient would like to see Dr. Sudie Bailey, and she was  encouraged to do so in a week's time.   DIET:  Regular.   PHYSICAL ACTIVITY:  No restrictions.      Osvaldo Shipper, MD  Electronically Signed     GK/MEDQ  D:  10/05/2005  T:  10/05/2005  Job:  [id number]   cc:   Mila Homer. Sudie Bailey, M.D.  Fax: [phone]

## 2010-08-14 NOTE — Group Therapy Note (Signed)
NAME:  Erica Tate, Erica Tate              ACCOUNT NO.:  [id number]   MEDICAL RECORD NO.:  [id number]          PATIENT TYPE:  INP   LOCATION:  IC04                          FACILITY:  [facility]   PHYSICIAN:  Edward L. Juanetta Gosling, M.D.DATE OF BIRTH:  June 21, 1975   DATE OF PROCEDURE:  01/25/2006  DATE OF DISCHARGE:                                   PROGRESS NOTE   PROBLEMS:  1. Respiratory failure.  2. Seizure disorder.  3. Difficulty with IV access.   HISTORY:  Erica Tate is overall about the same.  She has no new problems  noted.  She was not able to be weaned yesterday.   PHYSICAL EXAMINATION:  GENERAL:  She is awake and alert but is sedated.  VITAL SIGNS:  Pulses in the 70s, blood pressure 130/67, O2 sat 100%,  respirations 16.  CHEST:  Her chest is very clear.  HEART:  Heart is regular.  ABDOMEN:  Her abdomen is soft.  EXTREMITIES:  Extremities do not show any edema.   Her white blood count 12,700, hemoglobin 10.7, platelets 273.  Metabolic  profile shows her CO2 is 20 on the borderline, glucose 149, albumin is 3.3.  Her blood gas on 30% rate of 16, 555 of PEEP, pO2 125, pCO2 31, pH 7.42.   Her chest x-ray shows no essential change.   ASSESSMENT:  She is about the same.   PLAN:  Will try weaning again today.      Edward L. Juanetta Gosling, M.D.  Electronically Signed     ELH/MEDQ  D:  01/26/2006  T:  01/26/2006  Job:  [id number]

## 2010-08-14 NOTE — Group Therapy Note (Signed)
NAME:  Erica Tate, PETZ              ACCOUNT NO.:  [id number]   MEDICAL RECORD NO.:  [id number]          PATIENT TYPE:  INP   LOCATION:  IC07                          FACILITY:  [facility]   PHYSICIAN:  Edward L. Juanetta Gosling, M.D.DATE OF BIRTH:  11-30-1975   DATE OF PROCEDURE:  04/09/2006  DATE OF DISCHARGE:                                 PROGRESS NOTE   PROGRESS NOTE: April 09, 2006   Ms. Landgren is being weaned some from the ventilator. She is waking up,  her Diprivan is being turned down. She is a little bit agitated and her  heart rate has gone up to about 110. Her blood pressure 120/66,  respirations are 19, O2 saturations in the 90's.  Her blood gas this  morning is listed as being on 100% which I think is an error. Her PO2  118, PCO2 of 27, pH 7.38.  Chest x-ray shows no significant change.   ASSESSMENT:  It is not clear at this point if she ready for extubation  so will see how she does.      Edward L. Juanetta Gosling, M.D.  Electronically Signed     ELH/MEDQ  D:  04/09/2006  T:  04/09/2006  Job:  [id number]

## 2010-08-14 NOTE — Group Therapy Note (Signed)
NAME:  Erica Tate, Erica Tate              ACCOUNT NO.:  [id number]   MEDICAL RECORD NO.:  [id number]          PATIENT TYPE:  INP   LOCATION:  IC07                          FACILITY:  [facility]   PHYSICIAN:  Edward L. Juanetta Gosling, M.D.DATE OF BIRTH:  24-Dec-1975   DATE OF PROCEDURE:  DATE OF DISCHARGE:                                 PROGRESS NOTE   Ms. Mccosh is sedated and intubated on the ventilator.  There have been  problems with her NG tube, and now apparently it was looped in the  esophagus and has been replaced, but the PICC line is now in the  brachiocephalic vein not in the original position.  There is no report  from the film post-PICC line insertion.  This morning, she is sedated so  I cannot judge her ability to respond, but on 30% FIO2, _tidal  volume__________ of 550, rate of 14, 5 of PEEP.  She has a blood gas of  7.40, PCO2 of 35, PO2 of 104.  Her chest is relatively clear without any  wheezes.  Her heart rate is 88, blood pressure 111/55, respirations 14  controlled by the ventilator, and her O2 saturation is 100%.   ASSESSMENT:  She I think is better.  There are concerns about  peripherally inserted central catheter line, gastrostomy tube, etc., but  at this point I am going to see if we can perhaps wean her today which  may render some of the other problems less important.  Oneal Deputy Juanetta Gosling, M.D.  Electronically Signed     ELH/MEDQ  D:  04/08/2006  T:  04/08/2006  Job:  [id number]

## 2010-08-14 NOTE — Group Therapy Note (Signed)
NAME:  Erica Tate, Erica Tate              ACCOUNT NO.:  [id number]   MEDICAL RECORD NO.:  [id number]          PATIENT TYPE:  INP   LOCATION:  IC04                          FACILITY:  [facility]   PHYSICIAN:  Edward L. Juanetta Gosling, M.D.DATE OF BIRTH:  28-Nov-1975   DATE OF PROCEDURE:  DATE OF DISCHARGE:                                 PROGRESS NOTE   SUBJECTIVE:  Erica Tate is overall much improved.  She was able to be  extubated yesterday and she says she feels much better.  She would like  to get her Foley catheter out.  She has apparently had some significant  problems with anxiety and I will leave that to be addressed by Dr.  Mila Homer. [place].   OBJECTIVE:  Her exam today shows her heart rates in the 60s, her blood  pressure 120/70, respirations about 12 and unlabored.  Her chest is very  clear.  Her heart is regular.   ASSESSMENT:  She is much improved.   PLAN:  Is to continue with current treatments and medication.  I will go  ahead and ask for her Foley catheter to be removed and I am think she  can probably be transferred from the intensive care unit.  I have added  Advair and Medrol, which hopefully she can tolerate; she says she cannot  tolerate the prednisone.      Edward L. Juanetta Gosling, M.D.  Electronically Signed     Oneal Deputy. Juanetta Gosling, M.D.  Electronically Signed    ELH/MEDQ  D:  01/28/2006  T:  01/28/2006  Job:  [id number]

## 2010-08-14 NOTE — Group Therapy Note (Signed)
NAME:  Erica Tate, Erica Tate              ACCOUNT NO.:  [id number]   MEDICAL RECORD NO.:  [id number]          PATIENT TYPE:  INP   LOCATION:  A203                          FACILITY:  [facility]   PHYSICIAN:  Edward L. Juanetta Gosling, M.D.DATE OF BIRTH:  04-24-1975   DATE OF PROCEDURE:  DATE OF DISCHARGE:                                 PROGRESS NOTE   Erica Tate was able to be successfully extubated yesterday and is doing  much better.  She says she wants to go home, but I have told that it is  too early for that.  She is much improved.  She is awake and alert.  Her  chest is very clear.   Her pulse is 83, blood pressure 130 6/83 respirations 16.  Her O2 sat in  the 90s.  Her chest is clear and she looks much better.   Dilantin level was 19, probably actually a bit high, So I am going to  back off some on her Dilantin.   ASSESSMENT:  She is better.   PLAN:  Is to transfer her out of the intensive care unit.      Edward L. Juanetta Gosling, M.D.  Electronically Signed     ELH/MEDQ  D:  04/10/2006  T:  04/10/2006  Job:  [id number]

## 2010-08-14 NOTE — Consult Note (Signed)
NAME:  Erica Tate, Erica Tate              ACCOUNT NO.:  [id number]   MEDICAL RECORD NO.:  [id number]          PATIENT TYPE:  INP   LOCATION:  IC04                          FACILITY:  [facility]   PHYSICIAN:  Edward L. Juanetta Gosling, M.D.DATE OF BIRTH:  03-04-1976   DATE OF CONSULTATION:  01/24/2006  DATE OF DISCHARGE:                                   CONSULTATION   HISTORY OF PRESENT ILLNESS:  Ms. Wynns is a 35 year old African American  female who has a history of asthma.  She has had apparently very severe  asthma that has been since about age 40.  She had seen Dr. Sudie Bailey in his  office recently then missed her next appointment.  She came to the emergency  room on the 19th of this month with asthma symptoms, and also had Bell's  palsy then.  She was seen on September 12 with chest discomfort felt to  perhaps be related to her asthma.  Then she came to the emergency room today  in severe respiratory distress and ended up having to be intubated.  Apparently, this started about 18 hours ago and was fairly acute.  She has  not had any chest pain.  She is now intubated and sedated, and I cannot get  any further history from her.  There is a note in a hospitalization from  July 10 that she had been intubated multiple times at [facility], and  that she has been noncompliant with medications.  She has a history of  seizure disorder as well.   FAMILY HISTORY:  Her family history is said to be with a strong family  history of asthma on old of history and physical.   SOCIAL HISTORY:  She is on disability.  She does not use any illicit drugs.  She does smoke.  In the past, she has been on Xopenex inhaler and nebulizer,  Advair 250/50, and Singulair 10 mg daily,  but is not clear if she is taking  them are not.   REVIEW OF SYSTEMS:  Also unable to be obtained.   PHYSICAL EXAMINATION:  GENERAL:  Shows that she is intubated on mechanical  ventilator.  As she was being brought to the intensive care  unit, after  intubation, she had a episode of vomitus and vomited up a large amount  material that looked like oatmeal.  Her airway was protected with the  endotracheal tube, however.  She has bilateral rhonchi.  Her heart is  Regular with tachycardia.  VITAL SIGNS:  Her blood pressure in the 120's, pulse 100, O2 sat 100%.  She  is 100% saturated on current settings.  HEENT:  She does open her eyes.  Her nose and throat are clear.  NECK:  Supple.  CHEST:  Rhonchi bilaterally.  HEART:  Regular without gallop.  ABDOMEN:  Soft, obese.  No masses felt.  Bowel sounds are sluggish.  EXTREMITIES:  Showed no edema.  CNS:  She is sedated.   LABORATORY DATA:  Blood gas on 60% rate __________450 rate of 20, 5 of PEEP  shows pO2 of 307, pCO2 of 30.7,  pH 7.46.  White count 6200, hemoglobin 12.2,  platelets 263.  B-met:  Glucose is 107, otherwise normal.  Dilantin level  less than 2.5. Chest x-ray shows no pneumonia.  Endotracheal tube has been  in good position.  NG tube at the distal esophagus.   ASSESSMENT:  She has got respiratory failure on the basis of asthma.  There  is a history of medication noncompliance.  I am not sure if that is still a  problem are not.  We should go ahead and put her on steroids, continue with  ventilation and try to see if we can wean her tomorrow.  Continue with all  her other treatments and follow.      Edward L. Juanetta Gosling, M.D.  Electronically Signed     ELH/MEDQ  D:  01/24/2006  T:  01/25/2006  Job:  [id number]

## 2010-08-14 NOTE — H&P (Signed)
NAME:  Erica Tate, Erica Tate              ACCOUNT NO.:  [id number]   MEDICAL RECORD NO.:  [id number]          PATIENT TYPE:  INP   LOCATION:  IC04                          FACILITY:  [facility]   PHYSICIAN:  Mila Homer. Sudie Bailey, M.D.DATE OF BIRTH:  08-24-1975   DATE OF ADMISSION:  01/24/2006  DATE OF DISCHARGE:  LH                                HISTORY & PHYSICAL   A 35 year old who presented to the emergency room in status asthmaticus.  She required intubation.   She was recently seen in the office 2 months ago.  She has been on Xopenex  inhaler and Dilantin 100 mg b.i.d.  She has also been on Advair b.i.d.   Not much history is available from her.  When she was in the office,  however, she noted that while she was living down in [place], she was  admitted to [facility] two times and, in fact, was intubated twice due to her  asthma.   PHYSICAL EXAMINATION:  VITAL SIGNS:  Currently vital signs are stable.  LUNGS:  Lungs show some expiratory wheezing throughout, but she is doing  well on the vent.  CARDIAC:  Heart is a regular rhythm and rate, about 90.  ABDOMEN:  Soft without organomegaly or mass.  She is obese.  EXTREMITIES:  There is no edema of the ankles.   Her white cell count today is 6200, 64 neutrophils, 29 lymphs, H&H 12.9 and  36.9, MCV of 79.  MET-7 shows a glucose of 107.  A pH of 7.46, PCO2 30, PO2  was 307 on the vent.  Her phenytoin level was less than 2.5.   ASSESSMENT:  1. Respiratory arrest secondary to status asthmaticus.  2. Chronic asthma.  3. Question seizure disorder (she may actually have pseudoseizures and the      data is not back from neurology).  4. Obesity.   She is admitted to the ICU.  She is intubated by the ER physician.  She is  being followed by Ramon Dredge L. Juanetta Gosling, M.D., of pulmonary.  She will receive  Lovenox prophylactically subcu daily, will be on Protonix IV and Rocephin 1  g IV q.24h.  Meanwhile, will continue with IV fluids and ventilation until  stable.  She will be on albuterol by nebulizer treatments and Solu-Medrol  125 mg IV q.6h.      Mila Homer. Sudie Bailey, M.D.  Electronically Signed     SDK/MEDQ  D:  01/24/2006  T:  01/25/2006  Job:  [id number]

## 2010-08-14 NOTE — H&P (Signed)
NAME:  Erica, Tate              ACCOUNT NO.:  [id number]   MEDICAL RECORD NO.:  [id number]          PATIENT TYPE:  INP   LOCATION:  A220                          FACILITY:  [facility]   PHYSICIAN:  Margaretmary Dys, M.D.DATE OF BIRTH:  October 19, 1975   DATE OF ADMISSION:  10/04/2005  DATE OF DISCHARGE:  LH                                HISTORY & PHYSICAL   PRIMARY CARE PHYSICIAN:  The patient is unassigned.   ADMISSION DIAGNOSES:  1.  Acute asthma exacerbation.  2.  Morbid obesity.  3.  Tobacco abuse.   CHIEF COMPLAINT:  Shortness of breath.   HISTORY OF PRESENT ILLNESS:  Ms. Erica Tate is a 35 year old African-American  female with history of asthma.  She presented to emergency room with  progressive shortness of breath with severe wheezing.  The patient has had  asthma since the age of 35 and has actually been intubated multiple times at  [facility].  Despite that, the patient is very poorly compliant with  her prescribed asthma medications.  She denies any significant cough.  The  patient states her symptoms are fairly frequent and occasionally she awakens  in the middle of the night.  Evaluation in the emergency room revealed that  she was in a fair amount of respiratory distress.  She received continuous  Xopenex nebulizations without much improvement.  The patient has now been  admitted for further evaluation and management, especially in the context of  her previous mechanical ventilation history.   REVIEW OF SYSTEMS:  A 10-point review of systems otherwise negative.  No  fever or chills.   MEDICATIONS:  1.  Xopenex nebulizers q.6h. p.r.n.  2.  Xopenex inhaler 2 puffs q.i.d. p.r.n.  3.  Advair 250/50 mcg 1 puff inhaler b.i.d.  4.  Singulair 10 mg p.o. once a day.   Please note that the patient does not take any of these medications.   ALLERGIES:  PENICILLIN, which caused anaphylaxis, and PREDNISONE causes  acne.   SOCIAL HISTORY:  The patient smokes about a pack  of cigarettes every 4 or 5  days.  She is currently on disability.  She lives with her female partner.  She denies any illicit drug use, including cocaine.   FAMILY HISTORY:  Noncontributory and a strong family history of asthma.   PHYSICAL EXAMINATION:  GENERAL:  Conscious, alert, in mild respiratory  distress.  VITAL SIGNS:  Her blood pressure is 143/77, pulse of 108, respirations 20,  blood pressure was 96.6, oxygen saturation was 100% on 2 L.  HEENT:  Normocephalic, atraumatic.  Oral mucosa was moist.  No exudates were  noted.  NECK:  Supple, no JVD.  LUNGS:  Reduced air entry bilaterally with bilateral rhonchi and end-  expiratory wheezes.  CARDIAC:  S1, S2, regular.  No S3, S4, gallops or rubs.  ABDOMEN:  Obese but soft and nontender.  Bowel sounds positive.  EXTREMITIES:  No edema.   LABORATORY DATA:  Blood gas on 3 L of oxygen showed a pH of 7.39, PCO2 35,  PO2 of 149, bicarbonate of 21, oxygen saturation was 100%.  ASSESSMENT AND PLAN:  Ms. Erica Tate is a 35 year old African-American  female who is now being admitted with an acute asthma exacerbation.  Plan is  to continue with Solu-Medrol 60 mg IV q.6h. and nebulizers q.2h. as needed.  She is a lot better now after the treatment she received in the emergency  room.  A chest x-ray does not show any evidence of infection or pneumonia.  Will not give her any antibiotics for now.   We will resume all her home medications at this time.  I think the patient  will do fairly well.  I do not see any indication for mechanical  ventilation, but will need to be observed closely in the intensive care unit  in case she shows any evidence of decline.  Discussed compliance issues with her in  detail.      Margaretmary Dys, M.D.  Electronically Signed     AM/MEDQ  D:  10/05/2005  T:  10/05/2005  Job:  [id number]

## 2010-08-14 NOTE — Discharge Summary (Signed)
NAME:  Erica Tate, Erica Tate              ACCOUNT NO.:  [id number]   MEDICAL RECORD NO.:  [id number]          PATIENT TYPE:  INP   LOCATION:  A207                          FACILITY:  [facility]   PHYSICIAN:  Mila Homer. Sudie Bailey, M.D.DATE OF BIRTH:  10/01/1975   DATE OF ADMISSION:  01/24/2006  DATE OF DISCHARGE:  11/03/2007LH                               DISCHARGE SUMMARY   This 35 year old woman was admitted to the hospital for status  asthmaticus January 24, 2006, and discharged January 29, 2006, for a 6-  day hospitalization.  Vital signs remained stable.   She was treated with intubation in the ICU for 3-4 days.  She was on IV  fluids.  During the course of hospitalization she gradually improved and  was extubated January 27, 2006, her fourth hospital day.   IVs were cut back, Foley was taken out.   She is discharged home on:  1. Medrol 4 mg daily.  2. Dilantin 100 mg t.i.d.  3. Advair 500/50 mcg a puff b.i.d.  4. Xopenex inhaler two puffs q.4h.   The recommendation was that she come back to the office within the week  for follow-up on this acute attack and also for full and careful  instruction on use of peak flow meters so we could identify her  asthmatic problems earlier.   FINAL DISCHARGE DIAGNOSES:  1. Status asthmaticus.  2. Chronic asthma.  3. Seizure disorder.  4. Respiratory arrest.  5. Morbid obesity.      Mila Homer. Sudie Bailey, M.D.  Electronically Signed     SDK/MEDQ  D:  05/05/2006  T:  05/05/2006  Job:  [id number]

## 2010-08-14 NOTE — H&P (Signed)
NAME:  YALDA, HERD              ACCOUNT NO.:  [id number]   MEDICAL RECORD NO.:  [id number]          PATIENT TYPE:  INP   LOCATION:  IC07                          FACILITY:  [facility]   PHYSICIAN:  Mila Homer. Sudie Bailey, M.D.DATE OF BIRTH:  1975/09/02   DATE OF ADMISSION:  04/07/2006  DATE OF DISCHARGE:  LH                              HISTORY & PHYSICAL   This 34 year old presented to the emergency department at [facility] today in status asthmaticus, similar to how she  presented three months ago.  She was intubated.  She began to have  coughing and wheezing last night and rapidly progressed tightness into  status.  Review of the record shows that she has had this twice when she  lived down in [place] and was intubated twice.  She also has a history  of questionable seizure disorder (vs. Pseudoseizures).   CURRENT MEDS:  1. Advair Diskus b.i.d.  2. Dilantin 300 mg t.i.d.  3. Xopenex as needed.   ADMISSION EXAM:  Showed a young woman who is in respiratory distress.  Her temperature is 98.9 with a blood pressure 135/87, the pulse 120, the  respiratory rate 32, the O2 sat was 97% initially.   At the time of my exam, she was on a ventilator.  Her lungs actually are  fairly clear throughout but her heart was tachycardic, rate of 150, but  she is moving her left arm trying to pull out her NG tube, so it is  apparent she was not totally knocked out.  Her abdomen was soft without  hepatosplenomegaly or mass.  She was obese.  There was no edema in the  ankles.   ADMISSION DIAGNOSES:  1. Respiratory arrest secondary to status asthmaticus.  2. Chronic asthma.  3. Question seizure disorder.  4. Obesity.   She is currently on a ventilator and being transported to the ICU.  Dr.  Juanetta Gosling, pulmonologist, will see her there.  She is on Diprivan IV and  now has received Solu-Medrol 250 mg IV STAT, following which she will  get 125 mg IV q.6h.  She will have albuterol neb  treatments every 4  hours and IV normal saline 100 mL an hour, Foley catheter.  She will  receive IV Dilantin.     Mila Homer. Sudie Bailey, M.D.  Electronically Signed    SDK/MEDQ  D:  04/07/2006  T:  04/07/2006  Job:  [id number]

## 2010-08-14 NOTE — H&P (Signed)
[place]  Patient:    Erica Tate, Erica Tate Visit Number: [id number] MRN: [id number]          Service Type: Attending:  Charlcie Cradle. Delford Field, M.D. [facility] Dictated by:   Charlcie Cradle. Delford Field, M.D. [facility] Adm. Date:  01/09/01   CC:         Colon Flattery, D.O.   History and Physical  DATE OF BIRTH:  [birth year]  CHIEF COMPLAINT:  Status asthmaticus.  HISTORY OF PRESENT ILLNESS:  A 35 year old African-American female, history of severe asthma and wheezing; symptoms became worse over the past three days. She has been diagnosed as having asthma since age 45.  Symptoms are worsening over the past several years.  Notes increased shortness of breath and difficulty with sleep.  Notes minimal cough.  No real sinus complaints.  Does note severe heartburn but is on no medication for reflux.  Notes some chest pain anteriorly.  Has been on Flovent for about a year.  Has been on prednisone in the past but this causes acne in the face.  Because her symptoms are progressively worse, she is referred to our office today, and when she came into the office she was in acute distress.  Therefore, we called EMS and sent her to the hospital directly from the office.  The patient had been started on oxygen therapy last week by the primary care Tayden Nichelson when her room air saturations were down in the 70% range.  PAST MEDICAL HISTORY:  Operations:  None.  Major medical illnesses:  None.  ALLERGIES:  PENICILLIN; PREDNISONE causes acne.  CURRENT MEDICATIONS: 1. Albuterol nebulization q.i.d. 2. Flovent 220 mcg strength one spray b.i.d. 3. Serevent one spray b.i.d. 4. Singulair 10 mg daily. 5. Combivent p.r.n.  SOCIAL HISTORY:  Patient has never smoked, is on disability because of asthma.  FAMILY HISTORY:  Allergies in the siblings and no asthma runs in the family.  REVIEW OF SYSTEMS:  Weight stable.  Does have severe reflux and heartburn. Denies any sinus complaints.  Does  note some extremity swelling and edema in the ankles.  No change in bowel or bladder pattern.  No other complaints.  PHYSICAL EXAMINATION:  VITAL SIGNS:  Temperature 97.5, pulse 70, blood pressure 126/80, saturation 99% on room air.  CHEST:  Showed inspiratory and expiratory wheezes with poor air movement.  GENERAL:  Patient is in acute distress using accessory muscles of respiration.  CARDIAC:  Showed resting tachycardia without S3, normal S1, S2.  ABDOMEN:  Soft, nontender.  Bowel sounds active.  EXTREMITIES:  Showed no edema or clubbing or venous disease.  NEUROLOGIC:  Intact.  HEENT:  Showed no jugular venous distention, lymphadenopathy.  Oropharynx clear.  Neck supple.  LABORATORY DATA:  Pending at the time of this dictation.  IMPRESSION:  Status asthmaticus with acute respiratory distress.  For this the patient will be admitted to intensive care, given IV Solu-Medrol, continuous albuterol treatment, and close follow-up.  Will assess the patient from there. ictated by:   Charlcie Cradle Delford Field, M.D. [facility] Attending:  Charlcie Cradle. Delford Field, M.D. [facility] DD:  01/09/01 TD:  01/09/01 Job: [id number]

## 2010-08-14 NOTE — Group Therapy Note (Signed)
NAME:  Erica Tate, Erica Tate              ACCOUNT NO.:  [id number]   MEDICAL RECORD NO.:  [id number]          PATIENT TYPE:  INP   LOCATION:  IC04                          FACILITY:  [facility]   PHYSICIAN:  Mila Homer. Sudie Bailey, M.D.DATE OF BIRTH:  1975-08-02   DATE OF PROCEDURE:  DATE OF DISCHARGE:                                   PROGRESS NOTE   PROGRESS NOTE   SUBJECTIVE:  The patient still remains in the ICU on a ventilator.   OBJECTIVE:  Temperature 97.4, pulse 75, respiratory rate 16.  Her lungs appear clear throughout anteriorly and laterally.  HEART:  The heart has a regular rhythm, rate of 70.  ABDOMEN:  Soft, there is no edema of the ankles.   She is on a ventilator and has a Foley in place with good urine flow.  Currently O2 sat is 100% on 40%. PH 7.44, PCO 2 is 32, PO2 147, most recent  blood gas is 30.   ASSESSMENT:  1. Respiratory arrest secondary to status asthmaticus.  2. Chronic asthma.  3. Lovenox prophylaxis.  4. Morbid obesity.   PLAN:  Continue the ventilator.  Dr. Juanetta Gosling is managing this.  Hopefully  she will be able to be extubated today.      Mila Homer. Sudie Bailey, M.D.  Electronically Signed     SDK/MEDQ  D:  01/25/2006  T:  01/25/2006  Job:  [id number]

## 2010-08-14 NOTE — Group Therapy Note (Signed)
NAME:  LARAINA, SULTON              ACCOUNT NO.:  [id number]   MEDICAL RECORD NO.:  [id number]          PATIENT TYPE:  INP   LOCATION:  A207                          FACILITY:  [facility]   PHYSICIAN:  Edward L. Juanetta Gosling, M.D.DATE OF BIRTH:  Mar 24, 1976   DATE OF PROCEDURE:  DATE OF DISCHARGE:  01/29/2006                                   PROGRESS NOTE   Patient of Dr. Michelle Nasuti.  Ms. Cai is improved.  She is out of the  Intensive Care Unit, and I am going to plan to go ahead and sign off at this  point.  Thank you very much for allowing me to see her with you.      Edward L. Juanetta Gosling, M.D.  Electronically Signed     ELH/MEDQ  D:  01/29/2006  T:  01/30/2006  Job:  [id number]

## 2010-08-14 NOTE — Group Therapy Note (Signed)
NAME:  Erica Tate, Erica Tate              ACCOUNT NO.:  [id number]   MEDICAL RECORD NO.:  [id number]          PATIENT TYPE:  INP   LOCATION:  IC04                          FACILITY:  [facility]   PHYSICIAN:  Mila Homer. Sudie Bailey, M.D.DATE OF BIRTH:  07-20-1975   DATE OF PROCEDURE:  DATE OF DISCHARGE:                                 PROGRESS NOTE   SUBJECTIVE:  Feeling better today.  She was extubated yesterday.   OBJECTIVE:  VITAL SIGNS:  Temperature is 98.1, pulse 84, O2 sat room air  is 98%.  GENERAL:  She is sitting up in a chair, feet on the floor.  She is well  developed and obese.  She is in no acute distress.  Oriented, alert.  LUNGS:  She is breathing easily.  Lungs are clear throughout.  There are  no intercostal retractions.  There is no use of accessory muscles of  respiration.  HEART:  Regular rhythm, rate of about 70.  EXTREMITIES:  There is no edema in the ankles.   ASSESSMENT:  1. Respiratory arrest secondary to asthma.  2. Chronic asthma.  3. Seizure disorder.  4. Morbid obesity.   PLAN:  1. Foley is taken out today.  2. She is on Advair 550 b.i.d. and on p.o. steroids, Dilantin 300 mg      q.h.s.  3. She will go out to the floor.  4. If she does well over the course of the day tomorrow, she will be      able to be discharged home tomorrow.  5. I am having respiratory therapy see her and instruct her in the use      of peak flow meters and have her keep a record so that followup in      5 days in the office I can review this with her.  6. We will also check a D-dimer just in case she has any other event      precipitating her asthma attack.  7. We will plan to have her go home on:      a.     Prednisone by mouth.      b.     Advair 550 puff b.i.d.      c.     Dilantin 100 mg t.i.d.      d.     Xopenex 2 puffs q.4 h. for asthma flare ups.  8. On this admission, she very quickly went into an asthma attack, so      we will use peak flows to see whether she has  deterioration in lung      function that we can change before she ends up in the hospital.  I      discussed all this with her.      Mila Homer. Sudie Bailey, M.D.  Electronically Signed     SDK/MEDQ  D:  01/28/2006  T:  01/28/2006  Job:  [id number]

## 2010-08-14 NOTE — Discharge Summary (Signed)
NAME:  Erica Tate, Erica Tate              ACCOUNT NO.:  [id number]   MEDICAL RECORD NO.:  [id number]          PATIENT TYPE:  INP   LOCATION:  A203                          FACILITY:  [facility]   PHYSICIAN:  Mila Homer. Sudie Bailey, M.D.DATE OF BIRTH:  May 07, 1975   DATE OF ADMISSION:  04/07/2006  DATE OF DISCHARGE:  01/14/2008LH                               DISCHARGE SUMMARY   This 35 year old was admitted to the hospital with status asthmaticus.  She was intubated in ER and brought to the intensive care unit where she  was on a ventilator for several days before being transferred out to the  floor.  She had benign 5-day hospitalization extending from April 07, 2006 to April 11, 2006.  Vital signs remained stable during this time.   Her admission white cell count was 7800, H&H 11.9/35.9, MCV of 79,  platelet count 225,000.  BMP showed a bicarb of 18 and glucose 100.  Her  admission Dilantin level was 4.8 and ABGs on the ventilator 40% showed  pH 7.34, pCO2 of 39, pO2 166, bicarb 21.  Triglycerides were 168 and  recheck Dilantin level 19.6.  Cholesterol 160.  Serial ABGs were  performed.   Admission chest x-ray showed subsegmental atelectasis in the lower lung  zones.  PICC line was placed.  She had NG tube.   She was admitted to the hospital and as noted above she was tubed in the  ED by the ER physician.  She was admitted to the ICU, given IV of normal  saline 100 mL/hr, Foley catheter, MVI daily, 1000 mg of IV Dilantin  daily, albuterol neb treatments every 4 hours and Solu-Medrol 250 mg IV  stat.  After stat dose she received Solu-Medrol 125 mg IV q.6 h. and was  given Lovenox 40 mg subcu daily, NG feeding with Pulmocare 30 mL/hr,  Levaquin 500 mg IV q.24 h.   She received some IV Ativan for anxiety and was put on a weaning  protocol her second day.  She was extubated the next day, given Zofran  for nausea and transferred to telemetry bed from the ICU her fourth day.  Solu-Medrol was  decreased to 40 mg IV q.6 h.  She was continued on  Lovenox, given albuterol by nebulizer, and Levaquin 500 mg daily.   She continued to improve and is ready for discharge home the following  day.   Apparently her home nebulizer had been broken when she came in.  The  patient has a peak flow meter, says the highest peak flow she has blown  would be 350 but she really has not been following peak flows.  Arrangements have been made for her to get a nebulizer and use Xopenex  __________ five times a day (170 amps refill for the year), use Advair  50/500 puff b.i.d. refill x11.  She is to use prednisone 10 mg two  b.i.d. at present #100 RF x0.  Follow up will be in the office in 2 days  and she will bring her peak flow sheets with her and all meds with her.  There is no  smoking problem in the family but she understands we need to  evaluate her totally to make sure she does not have any other  hospitalizations for her asthma.   FINAL DISCHARGE DIAGNOSES:  1. Status asthmaticus.  2. Respiratory arrest.  3. Seizure disorder.  Note on discharge home she will be on Dilantin      300 mg t.i.d. #90 with refills x11.      Mila Homer. Sudie Bailey, M.D.  Electronically Signed     SDK/MEDQ  D:  04/11/2006  T:  04/11/2006  Job:  [id number]

## 2010-08-14 NOTE — Discharge Summary (Signed)
[place]  Patient:    Erica Tate Visit Number: [id number] MRN: [id number]          Service Type: MED Location: MICU 2105 01 Attending Physician:  Caleb Popp Dictated by:   Earley Favor, RN, MSN, ACNP Admit Date:  01/09/2001 Disc. Date: 01/11/01   CC:         Prudy Feeler, M.D., [place], [place]   Discharge Summary  DATE OF BIRTH:  [birth year]  DISCHARGE DIAGNOSES: 1. Status asthmaticus with vocal cord dysfunction. 2. Maxillary sinusitis.  HISTORY OF PRESENT ILLNESS:  Ms. Erica Tate is a 35 year old black female with a history of asthma since childhood, now with a one-week history of increasing shortness of breath, increasing wheezing, especially at nighttime.  The patient started on home O2 one week ago by primary care physician and now with progressive bronchospastic state and was admitted to ICU at [facility] for further evaluation and treatment.  LABORATORY DATA:  Sodium 141, potassium 4.2, chloride 103, CO2 24, glucose 177, BUN 6, creatinine 0.8, calcium 10.3.  WBC 9.0, hemoglobin 11.4, hematocrit 34.9, platelets 311.  Arterial blood gas revealed pH 7.40, pCO2 45, pO2 100.  On 2 L nasal cannula her blood gas revealed pH 7.39, pCO2 39, pO2 134.  CK 56, CK-MB 4.9, troponin I 0.89.  Limited CT of the sinuses showed complete opacification of the right maxillary sinus, right anterior ethmoids, with suspicion of small mucocele in the region of the right ostiomeatal complex.  Difficult to be certain, as this is limited study.  Follow-up after antimicrobial therapy may be helpful.  Chest x-ray shows mild hyperinflation.  No evidence of acute infiltration, consolidation, pleural effusion, or pneumothorax.  HOSPITAL COURSE: #1 - STATUS ASTHMATICUS WITH KNOWN ASTHMA:  The patient was admitted to [facility] to 2100, Intensive Care Unit.  She underwent the usual pharmaceutical interventions consisting of IV  steroids, IV antibiotics, along with nebulized bronchodilators.  She reached maximal hospital benefit by January 11, 2001.  Her vocal cord dysfunction was addressed with Protonix. Her asthma was addressed with a continuation of Serevent and Flovent, along with p.r.n. albuterol and nebulizers.  She was ambulated around the unit approximately for 250 feet with O2 saturations remaining at 94- to 95-percentile range, not demonstrating a need for home oxygen.  She is discharged home in improved condition.  #2 - MAXILLARY SINUSITIS:  Maxillary sinusitis was demonstrated by CT scan. She was placed on nasal hygiene consisting of Rhinocort Aqua along with sodium chloride nasal spray along with antimicrobial therapy to address her sinusitis.  Of note, she is allergic to PENICILLIN; therefore, Tequin was used.  She is allergic to PREDNISONE, which creates a facial rash; therefore, she was given a Depo-Medrol injection.  She has demonstrated no allergy to injected steroids.  MEDICATIONS: 1. Tequin 400 mg 1 a day for 10 days. 2. Serevent 2 puffs b.i.d. 3. Rhinocort Aqua 2 puffs q.d. 4. Normal saline/salt water normal strength nasal spray 2 puffs q.i.d. 5. Protonix 40 mg 1 q.d. 6. Humibid L.A. 2 tablets b.i.d. 7. Albuterol 2.5 nebulizer p.r.n. only as needed. 8. Flovent 220 2 puffs b.i.d.  ACTIVITY:  As tolerated.  DIET:  She should be on a restricted-calorie diet, but no restrictions.  SPECIAL INSTRUCTIONS:  She is to use only the medications listed.  She does not need oxygen.  CONDITION ON DISCHARGE:  Improved. Dictated by:   Earley Favor, RN, MSN, ACNP Attending Physician:  Shan Levans  Dennard Nip DD:  01/11/01 TD:  01/11/01 Job: [id number]

## 2010-08-14 NOTE — Group Therapy Note (Signed)
NAME:  Erica Tate, Erica Tate              ACCOUNT NO.:  [id number]   MEDICAL RECORD NO.:  [id number]          PATIENT TYPE:  INP   LOCATION:  A203                          FACILITY:  [facility]   PHYSICIAN:  Edward L. Juanetta Gosling, M.D.DATE OF BIRTH:  [birth year]   DATE OF PROCEDURE:  DATE OF DISCHARGE:                                 PROGRESS NOTE   PRIMARY CARE PHYSICIAN:  Mila Homer. Sudie Bailey, M.D.   SUBJECTIVE:  Erica Tate says she is a lot better.  She has moved out of  the intensive care unit and does seem to feel better.  She has no new  complaints.  She is less short of breath than she has been.   OBJECTIVE:  Exam shows temperature is 97, pulse 71, respirations 20,  blood pressure 136/86, O2 saturations 99% on room air.  Her chest is  clearer or she is moving air well.  Her heart is regular.  Her abdomen  is soft.   ASSESSMENT:  She is better.   PLAN:  Plan is that she tells me that her nebulizer at home is not  functioning, which of course may be part of a problem with getting back  on the ventilator, so, I am going to see what we can do about getting  her a new nebulizer machine, make sure she has got her prescriptions  etc.      Ramon Dredge L. Juanetta Gosling, M.D.  Electronically Signed     ELH/MEDQ  D:  04/11/2006  T:  04/11/2006  Job:  [id number]

## 2010-08-14 NOTE — Consult Note (Signed)
NAME:  Erica Tate, Erica Tate              ACCOUNT NO.:  [id number]   MEDICAL RECORD NO.:  [id number]          PATIENT TYPE:  INP   LOCATION:  IC07                          FACILITY:  [facility]   PHYSICIAN:  Edward L. Juanetta Gosling, M.D.DATE OF BIRTH:  05/11/1975   DATE OF CONSULTATION:  DATE OF DISCHARGE:                                 CONSULTATION   HISTORY OF PRESENT ILLNESS:  Ms. Rossmann is a 35 year old with history of  asthma and who was admitted to the hospital with respiratory failure.  She had been hospitalized here in October2007 with a similar episode.  She has had severe asthma since about the age of 14.  She also has a  history of seizure disorder.  She came to the emergency room on the day  of admission with what seems to have been acute respiratory distress,  ended up having to be intubated.  She apparently started having symptoms  yesterday which got worse and worse.  I do not have all that information  because she is intubated and sedated now.  Her as mentioned, she has a  seizure disorder.  She was intubated with an asthmatic episode in  October of this year.   FAMILY HISTORY:  York Spaniel to be positive for asthma, but there is also a  history of cancer according to the ER record.  As mentioned, I cannot  get a lot of this information from her right now.   SOCIAL HISTORY:  She is on disability.  She apparently does not use any  illicit drugs but does smoke about a pack of cigarettes daily.  I am not  sure if she drinks alcohol or not.   REVIEW OF SYSTEMS:  Unable to be obtained.   PHYSICAL EXAMINATION:  GENERAL:  She does open her eyes and look around.  She is intubated on a ventilator.  VITAL SIGNS:  Pulse is 112, O2 saturations 100%, blood pressure 118/48,  respirations 16.  HEENT:  Mucous membranes are somewhat dry.  She has an NG tube in place.  CHEST:  Actually fairly clear now with decreased breath sounds.  I don't  hear a lot of wheezes.  HEART:  Regular with tachycardia.  ABDOMEN:  Soft.  EXTREMITIES:  Showed no edema present.  CENTRAL NERVOUS SYSTEM:  Examination is grossly intact.   STUDIES:  Her Chest x-ray:  Shows atelectasis.  Endotracheal tube in  good position.  Her Dilantin level is 4.8, and she is on IV Cerebyx now.  Blood gas on 40% 550 rate of 14, 5 of PEEP, pH 7.34, pCO2 of 39, pO2 of  166.  She has now been _on oxygen at _________30%.   ASSESSMENT:  She has severe asthma.  She does have a seizure disorder as  well.  She is intubated and stable at this point.  I am going to go  ahead and get a CBC/B-met, put her on Lovenox, put her on Flowtron,  start NG  feedings.  She is going to have a PICC-line placed because we cannot  keep all of her medications going, and I will start her on antibiotics  to be certain we are not missing any infection.  She does have  atelectasis by chest x-ray, but it is not clear if this represents just  atelectasis or perhaps even pneumonia.  She is already on steroids.      Edward L. Juanetta Gosling, M.D.  Electronically Signed     ELH/MEDQ  D:  04/07/2006  T:  04/08/2006  Job:  [id number]

## 2010-08-14 NOTE — Group Therapy Note (Signed)
NAME:  Erica Tate, Erica Tate              ACCOUNT NO.:  [id number]   MEDICAL RECORD NO.:  [id number]          PATIENT TYPE:  INP   LOCATION:  IC04                          FACILITY:  [facility]   PHYSICIAN:  Edward L. Juanetta Gosling, M.D.DATE OF BIRTH:  12/31/1975   DATE OF PROCEDURE:  01/25/2006  DATE OF DISCHARGE:                                   PROGRESS NOTE   SUBJECTIVE:  Erica Tate is better this morning.  She is sedated, but  arousable.   OBJECTIVE:  VITAL SIGNS:  Her heart rate is now 99, blood pressure 118/74,  respirations 16, O2 saturations 100%.  CHEST:  Her chest is clear.  HEART:  Heart is regular.  ABDOMEN:  Her abdomen is soft.   Her blood gas on 40%, 550, rate of 16, 5 of PEEP shows pH 7.44, pCO2 of 31,  pO2 of 147.   Chest x-ray shows no significant change.   ASSESSMENT:  She is better.   PLAN:  Plan is to continue with current treatments and medications.  Will  try to wean today.  She has respiratory failure from asthma.  She has a  seizure disorder.  Her Dilantin level was subtherapeutic when she arrived  and it is not totally clear if she has been taking her medications, but she  does seem to be improved.      Edward L. Juanetta Gosling, M.D.  Electronically Signed     ELH/MEDQ  D:  01/25/2006  T:  01/25/2006  Job:  [id number]

## 2010-08-14 NOTE — Group Therapy Note (Signed)
NAME:  Erica Tate, Erica Tate              ACCOUNT NO.:  [id number]   MEDICAL RECORD NO.:  [id number]          PATIENT TYPE:  INP   LOCATION:  IC07                          FACILITY:  [facility]   PHYSICIAN:  Mila Homer. Sudie Bailey, M.D.DATE OF BIRTH:  04-03-1975   DATE OF PROCEDURE:  DATE OF DISCHARGE:                                 PROGRESS NOTE   SUBJECTIVE:  The patient is still on a vent in the ICU.   OBJECTIVE:  The pulse is 88, respiratory rate 14 on a vent.  LUNGS: Appear clear.  HEART: Regular rhythm but a rate of 120.  ABDOMEN: Soft and obese without hepatosplenomegaly, mass or tenderness.   Today the O2 sat is 98% and the blood gas showed pH of 7.4, 1 PCO2 35,  PO2 104.   ASSESSMENT:  1. Respiratory arrest.  2. Status asthmaticus.   PLAN:  Discussed with Dr. Shaune Pollack. He may wean her today, meanwhile  keep her on the Diprivan and the vent. Long term, she will need to stay  on medications and will also need a peak flow device and check her peak  flows daily, possibly twice a day since this now represents the fourth  time she has needed intubation in the last 7 years.      Mila Homer. Sudie Bailey, M.D.  Electronically Signed     SDK/MEDQ  D:  04/08/2006  T:  04/08/2006  Job:  [id number]

## 2010-08-14 NOTE — Group Therapy Note (Signed)
NAME:  Erica Tate, Erica Tate              ACCOUNT NO.:  [id number]   MEDICAL RECORD NO.:  [id number]          PATIENT TYPE:  INP   LOCATION:  IC04                          FACILITY:  [facility]   PHYSICIAN:  Mila Homer. Sudie Bailey, M.D.DATE OF BIRTH:  January 14, 1976   DATE OF PROCEDURE:  DATE OF DISCHARGE:                                 PROGRESS NOTE   The subject is feeling better today. She finally was extubated this  morning.   OBJECTIVE:  Temperature 98.2. Pulse 67. Respiratory rate 16. Blood  pressure 132/91. She is sitting up in a recliner breathing well on O2 by  nasal prongs. She is in no acute distress. She is well developed and  morbidly obese.  LUNGS:  Show decreased breath sounds throughout, but she is moving air  without intercostal retractions, without use of accessory muscles or  respiration.  HEART: Regular rhythm without murmur, rate of 60.  There is no edema to the ankles.  O2 SAT: On 2 liters, is 98%.   The pH today is 7.44, the P showed 232, PO2 143 and O2 100%.   ASSESSMENT:  1. Respiratory arrest secondary to status asthmaticus.  2. Chronic severe asthma.  3. Morbid obesity.   PLAN:  Continue with antibiotics, breathing treatments and hope to  transfer out of the ICU by tomorrow.      Mila Homer. Sudie Bailey, M.D.  Electronically Signed     SDK/MEDQ  D:  01/27/2006  T:  01/27/2006  Job:  [id number]

## 2010-08-14 NOTE — Group Therapy Note (Signed)
NAME:  Erica Tate, Erica Tate              ACCOUNT NO.:  [id number]   MEDICAL RECORD NO.:  [id number]          PATIENT TYPE:  INP   LOCATION:  IC04                          FACILITY:  [facility]   PHYSICIAN:  Mila Homer. Sudie Bailey, M.D.DATE OF BIRTH:  [birth year]   DATE OF PROCEDURE:  01/26/2006  DATE OF DISCHARGE:                                   PROGRESS NOTE   SUBJECTIVE:  The patient is still in the ICU on a vent.   OBJECTIVE:  VITAL SIGNS:  Temperature 99 degrees.  Pulse 100.  Respiratory  rate 16.  She is on the vent as above.  She is much more alert today.  LUNGS:  There are some rhonchi throughout.  HEART:  Regular rhythm, rate about 90.  ABDOMEN:  Appears to be soft.  EXTREMITIES:  There is no edema of the ankles.   Today the pH is 7.42, pCO2 31, pO2 125.  White cell count is 12,700 of which  73% neutrophils, 4 lymphs.  She has an H & H  of 10.7 and 32.1 with an MCV  of 78.  Chloride 115.  Glucose 149.   ASSESSMENT:  1. Respiratory arrest secondary to status asthmaticus.  2. Chronic asthma.   PLAN:  She is being weaned from the vent right now.  Discussed her case with  Dr. Shaune Pollack, Pulmonologist, by phone.  Hopefully she will be weaned  today, out of the unit by tomorrow, and home within the next several days.      Mila Homer. Sudie Bailey, M.D.  Electronically Signed     SDK/MEDQ  D:  01/26/2006  T:  01/26/2006  Job:  [id number]

## 2010-08-15 NOTE — Consult Note (Signed)
NAME:  Erica Tate, Erica Tate              ACCOUNT NO.:  [id number]  MEDICAL RECORD NO.:  [id number]           PATIENT TYPE:  I  LOCATION:  1521                         FACILITY:  [facility]  PHYSICIAN:  Erica Tate          DATE OF BIRTH:  April 15, 1975  DATE OF CONSULTATION:  08/01/2010 DATE OF DISCHARGE:                                CONSULTATION   REASON FOR CONSULTATION:  I was asked to see the patient by Dr. Mikeal Tate for the evaluation asthma exacerbation.  HISTORY OF PRESENT ILLNESS:  This is a 35 year old female with a history of what sounds like moderate persistent asthma who is a current smoker who presented to the [facility] ED with 2 days of increased cough and wheezing.  She was using her albuterol rescue with increased frequency at home with little effect.  She has a history of severe exacerbations and multiple intubations and stays in ICU.  It is not clear what her peak flows were in the ER.  She was given a standard treatment with bronchodilators and steroids and 5 hours later she feels much less dyspneic and more comfortable.  She denies significant allergy symptoms. She does say that she has had some recent sinusitis with postnasal drip. She also seems to have an uncontrolled GERD symptoms.  Otherwise, she has had no fevers, night sweats, or chills.  She has no other symptoms to report.  REVIEW OF SYSTEMS:  10/14 systems unremarkable unless state above otherwise in the HPI.  PAST MEDICAL HISTORY: 1. Morbid obesity. 2. Tobacco use. 3. Migraine headaches. 4. Seizure disorder.  PAST SURGICAL HISTORY:  Denies.  SOCIAL HISTORY:  Unemployed, on disability.  Smokes about half pack a day.  No alcohol.  No illicit substances.  FAMILY HISTORY:  Mother and father are alive and well.  No significant medical problems in the siblings.  ALLERGIES: 1. PENICILLIN, which causes rash and tongue swelling. 2. PREDNISONE, which causes rash and tongue swelling.  MEDICATIONS:  Depakote,  enoxaparin, insulin, ipratropium, Xopenex, Solu- Medrol, moxifloxacin, nicotine patch, Protonix, acetaminophen, Dulcolax, Zofran, MiraLax, tramadol.  PHYSICAL EXAMINATION:  VITAL SIGNS:  Temperature of 97.4, heart rate of 108, respirations are 22, blood pressure 135/83, SpO2 100% on 2 L. GENERAL:  No apparent distress, morbidly obese.  Speaking in full sentences. ENT:  Moist use membranes.  No maxillary sinus pain with palpation. NECK:  No masses. LYMPH:  No cervical or supraclavicular lymphadenopathy. CARDIOVASCULAR:  Tachycardic.  No murmur. PULMONARY:  Very faint wheezing bilaterally. ABDOMEN:  Soft, nontender. EXTREMITIES:  No lower extremity edema or clubbing. SKIN:  No rashes.  LABORATORY DATA:  Largely unremarkable.  IMAGING DATA:  PA and lateral chest x-ray maybe shows some questionable bronchial markings, but otherwise unremarkable.  A maxillofacial CT shows an air-fluid level in the right maxillary sinus consistent with acute sinusitis.  There is also mild mucosal thickening involving the left maxillary, bilateral isthmoid, and right frontal sinuses consistent with chronic sinusitis.  IMPRESSION AND PLAN:  This is a 35 year old current smoker [name] female with a history of moderate persistent asthma who presents with exacerbation.  The etiology  of her exacerbation is not totally clear, but probably contributed to both by her sinusitis, uncontrolled gastroesophageal reflux disease, and clearly smoking.  She was on only albuterol at home, which is grossly under treating her asthma.  With discharge, she should be provided with at very minimum an inhaled corticosteroid in addition to her short acting beta-agonist. Antibiotics for sinusitis seems appropriate.  Initiating a PPI would also be appropriate.  Otherwise, the standard therapies with Solu-Medrol and bronchodilators should help her continue to improve clinically.     Erica Tate     CH/MEDQ  D:   08/01/2010  T:  08/01/2010  Job:  [id number]  Electronically Signed by Joie Bimler  on 08/15/2010 01:30:52 AM

## 2010-08-20 ENCOUNTER — Encounter: Payer: Self-pay | Admitting: Internal Medicine

## 2010-08-21 ENCOUNTER — Other Ambulatory Visit: Payer: Medicaid Other

## 2010-08-21 ENCOUNTER — Ambulatory Visit (INDEPENDENT_AMBULATORY_CARE_PROVIDER_SITE_OTHER): Payer: Medicare Other | Admitting: Internal Medicine

## 2010-08-21 ENCOUNTER — Encounter: Payer: Self-pay | Admitting: Internal Medicine

## 2010-08-21 DIAGNOSIS — J45909 Unspecified asthma, uncomplicated: Secondary | ICD-10-CM | POA: Insufficient documentation

## 2010-08-21 DIAGNOSIS — R0683 Snoring: Secondary | ICD-10-CM | POA: Insufficient documentation

## 2010-08-21 DIAGNOSIS — R0989 Other specified symptoms and signs involving the circulatory and respiratory systems: Secondary | ICD-10-CM

## 2010-08-21 LAB — IGE: IgE (Immunoglobulin E), Serum: 91.8 IU/mL (ref 0.0–180.0)

## 2010-08-21 NOTE — Assessment & Plan Note (Signed)
High risk asthma. Multiple triggers. Poor compliance. REcently quit smoking. BAseline poor control. Aggravated by allergies, OSA, and GERD  PLAn Continue symbicort  Get full PFTs Get igE level ROV after above Sleep apnea referral Stay quit from smoking GERD control - diet sheet given Explained need for compliance and followup

## 2010-08-21 NOTE — Progress Notes (Signed)
  Subjective:    Patient ID: Erica Tate, female    DOB: 1975-04-05, 35 y.o.   MRN: [id number]  HPI Morbidly obese female. Limited smoker. Quit May 2012. Admitted early may 2012 for AEasthma and discharged. ADvair changed to symbicort. Here to establish pulmonary followup. Feels well. Has quit smoking. Asthma hx is as below  ASthma - since age 76. Diagnosed by Dr John Giovanni per hx per hx and spirometry per patient.  Symptoms: dyspnea, chest tightness, and occ cough and wheeze but no sputum Risk: Multiple ER visits - 2 times in 2011, 4 times in 2010 and probably 10 times in 2009. Hospitalizations: " a whole lot". ICU: "intubated too four times" Medications: Control medications: Was on advair for years but switched to symbicort may 2012 after hospitalization. Remote singulair use. No allergy shots. No hx of xolair Rescue: albuterol prn - average use is several times a day but is very variable.  Compliance: admits to poor compliance Triggers: fresh cut grass, pollen, dust, idiopathic, cockroaches are in the house, weather change, hot weather, summer is worst Co-morbid: Morbid Obesity, GERD +, Undiagnosd OSA + (snores, apneic spells, excess daytime somnloence+), Allergies, Seizures   Review of Systems  Constitutional: Negative for fever and unexpected weight change.  HENT: Negative for ear pain, nosebleeds, congestion, sore throat, rhinorrhea, sneezing, trouble swallowing, dental problem, postnasal drip and sinus pressure.   Eyes: Negative for redness and itching.  Respiratory: Negative for cough, chest tightness, shortness of breath and wheezing.   Cardiovascular: Negative for palpitations and leg swelling.  Gastrointestinal: Negative for nausea and vomiting.  Genitourinary: Negative for dysuria.  Musculoskeletal: Negative for joint swelling.  Skin: Negative for rash.  Neurological: Negative for headaches.  Hematological: Does not bruise/bleed easily.  Psychiatric/Behavioral:  Negative for dysphoric mood. The patient is not nervous/anxious.        Objective:   Physical Exam  [vitalsreviewed. Constitutional: She is oriented to person, place, and time. She appears well-developed and well-nourished. No distress.       obese  HENT:  Head: Normocephalic and atraumatic.  Right Ear: External ear normal.  Left Ear: External ear normal.  Mouth/Throat: Oropharynx is clear and moist. No oropharyngeal exudate.  Eyes: Conjunctivae and EOM are normal. Pupils are equal, round, and reactive to light. Right eye exhibits no discharge. Left eye exhibits no discharge. No scleral icterus.  Neck: Normal range of motion. Neck supple. No JVD present. No tracheal deviation present. No thyromegaly present.  Cardiovascular: Normal rate, regular rhythm, normal heart sounds and intact distal pulses.  Exam reveals no gallop and no friction rub.   No murmur heard. Pulmonary/Chest: Effort normal and breath sounds normal. No respiratory distress. She has no wheezes. She has no rales. She exhibits no tenderness.  Abdominal: Soft. Bowel sounds are normal. She exhibits no distension and no mass. There is no tenderness. There is no rebound and no guarding.  Musculoskeletal: Normal range of motion. She exhibits no edema and no tenderness.  Lymphadenopathy:    She has no cervical adenopathy.  Neurological: She is alert and oriented to person, place, and time. She has normal reflexes. No cranial nerve deficit. She exhibits normal muscle tone. Coordination normal.  Skin: Skin is warm and dry. No rash noted. She is not diaphoretic. No erythema. No pallor.  Psychiatric: She has a normal mood and affect. Her behavior is normal. Judgment and thought content normal.          Assessment & Plan:

## 2010-08-21 NOTE — Patient Instructions (Addendum)
#  ASthma  - please have full PFT breating test  - return to see me after that  - continue your medications  - please have blood work for IgE #Smoking  - stay quit #Acid reflux  - take diet sheet   - continue medications #possible sleep apnea  - see sleep specialist in our office #Followup   - after PFT

## 2010-08-27 NOTE — H&P (Signed)
NAME:  Erica Tate, Erica Tate              ACCOUNT NO.:  [id number]  MEDICAL RECORD NO.:  [id number]           PATIENT TYPE:  LOCATION:                                 FACILITY:  PHYSICIAN:  Lonia Blood, M.D.      DATE OF BIRTH:  [birth year]  DATE OF ADMISSION:  07/31/2010 DATE OF DISCHARGE:                             HISTORY & PHYSICAL   CHIEF COMPLAINT:  "I can't breath."  HISTORY OF PRESENT ILLNESS:  This 35 year old female who is followed in primary care by Dr. John Giovanni.  She presents to [facility] Emergency Room with 2 days of increased cough and wheezing.  She does have a history of asthma.  She takes metered-dose inhaler at home, but this was providing no improvement.  She does have a tobacco smoking history and continues to smoke less than one-half pack per day of cigarettes.  She reports having difficulty speaking in full sentences without dyspnea.  Activity has been difficult with shortness of breath. She also describes a degree of orthopnea.  She describes some central chest pain with coughing.  This is without radiation, diaphoresis, or nausea.  She describes some yellow green sputum, which has been present for the past 2 days.  PAST MEDICAL HISTORY: 1. Bronchial asthma with exacerbation, Aug 14, 2007. 2. Obesity. 3. Tobacco abuse. 4. Left eye conjunctivitis. 5. History of migraine. 6. History of seizure. 7. Medical noncompliance. 8. Sinus tachycardia. 9. Subtherapeutic Dilantin level.  CURRENT MEDICATIONS:  Per patient reports: 1. Albuterol 2 puffs inhaled as needed. 2. Depakote 500 mg twice daily.  FAMILY HISTORY:  There is a family propensity for hypertension, diabetes, cancer, asthma, and congestive heart failure.  SOCIAL HISTORY:  The patient is unemployed.  On disability.  She smokes less than half a pack daily.  No alcohol or illicit drugs.  REVIEW OF SYSTEMS:  EYES:  No cataracts, glaucoma, visual loss.  EARS: No hearing loss, discharge, pain.   NOSE:  No rhinitis or sinusitis. MOUTH/THROAT:  No oral or dental pain.  No dysphagia.  CARDIAC:  She does describe some central chest pain worse with coughing.  There is no radiation, diaphoresis, nausea, or vomiting.  She has no known history of coronary artery disease.  LUNGS:  Dyspnea with exertion and a degree of orthopnea.  Cough with green, yellow sputum.  Difficulty speaking in full sentences secondary to dyspnea and increased wheezing has been noted.  Her prescribed metered-dose inhaler has been relatively ineffective.  ABDOMEN:  No pain, nausea, vomiting.  Urinary genital:  No dysuria or other symptoms suggest of urinary tract infection. MUSCULOSKELETAL:  No myalgias or arthralgias.  She states she feels somewhat weak compared to her baseline.  HEMATOLOGIC:  No abnormal bleeding or bruising.  PHYSICAL EXAMINATION:  VITAL SIGNS:  Temperature 98.4, pulse 108 to 136, respirations 24, blood pressure 149/96.  O2 sats 100%. GENERAL APPEARANCE:  This is an obese middle-aged female in no distress. She is alert and cooperative.HEENT:  Head:  Normocephalic.  Eyes:  Pupils equal and reactive.  Ears: Canals clear and hearing normal to conversational tone.  Nose:  Nares patent without discharge  noted.  Oral mucosa:  Pink and moist. NECK:  No jugular venous distention, bruits, adenopathy, or thyromegaly. CARDIAC:  Rate and rhythm regular without murmur, S3, S4.  Her rate is somewhat tachycardic.  There is no peripheral edema and negative Homans. LUNGS:  Somewhat bronchial sounds in the bases bilaterally.  There is some mild expiratory wheezes from the upper airways.  She does have difficulty speaking in full sentences due to dyspnea. ABDOMEN:  Soft and obese with positive bowel sounds.  No pain, guarding, or rebound tenderness. URINARY GENITAL:  No bladder pain or CVA tenderness. MUSCULOSKELETAL:  Range of motion is full and strength 5/5 and equal x4. NEUROLOGIC:  Cranial nerves II through  XII grossly intact.  No unilateral or focal defects.  She is alert and oriented. HEMATOLOGIC:  No abnormal bleeding or bruising seen.  RADIOLOGY AND LABORATORY DATA:  A two-view chest x-ray notes bronchial thickening with no focal infiltrate or evidence of edema.  I-STAT 8, total CO2 24, ionized calcium 1.14, hemoglobin 12.9, hematocrit 38.0, sodium 140, potassium 4.1, chloride 106, glucose 92, BUN 7, and creatinine 1.0.  CBC with differential found WBC 9.0, hemoglobin low 11.6, hematocrit low 35.9, platelets clump appear adequate and differential is pending.  IMPRESSION/PLAN: 1. Asthma exacerbation.  We will place her on Xopenex 0.63 mg     nebulizers 4 times daily and every 4 hours as needed given her     current tachycardia.  We will start her on Solu-Medrol 60 mg IV 3     times daily.  IV fluids with normal saline at 60 cc/hour.  Given     her green-yellow sputum, we will send specimen for culture and     place her on antibiotic therapy with Avelox 400 mg IV daily. 2. Chest pain.  We will obtain EKG q.6 h. x3.  Also, cardiac enzymes     q.6 h. x3.  We will follow on telemetry bed. 3. Seizure disorder.  We will continue her home dosing of Depakote 500     mg p.o. twice daily and check a Depakote level.  I have put her on     seizure precautions. 4. Tobacco abuse.  14 mg nicotine patch and change once daily.     Cessation consult. 5. DVT prophylaxis.  We will use Lovenox 40 mg subcu daily as     prophylaxis. 6. Code status.  The patient will be full code.     Everett Graff, N.P.   ______________________________ Lonia Blood, M.D.    TC/MEDQ  D:  07/31/2010  T:  07/31/2010  Job:  [id number]  cc:   Mila Homer. Sudie Bailey, M.D. Fax: [phone]  Electronically Signed by Everett Graff N.P. on 07/31/2010 06:57:03 PM Electronically Signed by Lonia Blood M.D. on 08/27/2010 11:43:09 AM

## 2010-09-08 ENCOUNTER — Ambulatory Visit: Payer: Medicaid Other | Admitting: Internal Medicine

## 2010-09-15 ENCOUNTER — Ambulatory Visit (INDEPENDENT_AMBULATORY_CARE_PROVIDER_SITE_OTHER): Payer: Medicare Other | Admitting: Pulmonary Disease

## 2010-09-15 ENCOUNTER — Encounter: Payer: Self-pay | Admitting: Pulmonary Disease

## 2010-09-15 ENCOUNTER — Ambulatory Visit (HOSPITAL_BASED_OUTPATIENT_CLINIC_OR_DEPARTMENT_OTHER): Payer: Medicare Other | Attending: Pulmonary Disease

## 2010-09-15 VITALS — BP 142/82 | HR 100 | Temp 97.9°F | Ht 64.0 in | Wt 270.2 lb

## 2010-09-15 DIAGNOSIS — G4733 Obstructive sleep apnea (adult) (pediatric): Secondary | ICD-10-CM | POA: Insufficient documentation

## 2010-09-15 NOTE — Progress Notes (Signed)
  Subjective:    Patient ID: Erica Tate, female    DOB: [birth year], 35 y.o.   MRN: [id number]  HPI The pt is a 35y/o female who I have been asked to see for possible osa.  Her history is significant for: -loud snoring with abnormal breathing pattern during sleep -frequent awakenings at night, and nonrestorative sleep -significant daytime sleepiness with any inactivity.  Falls asleep with tv, reading, computer.  Some sleepiness with driving longer distances -weight up 30 pounds last 2 yrs, and epworth score 17.  Sleep Questionnaire: What time do you typically go to bed?( Between what hours) 10:00 to midnight How long does it take you to fall asleep? about 30 mins How many times during the night do you wake up? What time do you get out of bed to start your day? 0630 Do you drive or operate heavy machinery in your occupation? No How much has your weight changed (up or down) over the past two years? (In pounds) 30 lb (13.608 kg) Have you ever had a sleep study before? No Do you currently use CPAP? No Do you wear oxygen at any time? No     Review of Systems  Constitutional: Positive for unexpected weight change. Negative for fever.  HENT: Positive for sneezing. Negative for ear pain, nosebleeds, congestion, sore throat, rhinorrhea, trouble swallowing, dental problem, postnasal drip and sinus pressure.   Eyes: Negative for redness and itching.  Respiratory: Positive for shortness of breath. Negative for cough, chest tightness and wheezing.   Cardiovascular: Positive for chest pain and palpitations. Negative for leg swelling.  Gastrointestinal: Negative for nausea and vomiting.  Genitourinary: Negative for dysuria.  Musculoskeletal: Negative for joint swelling.  Skin: Negative for rash.  Neurological: Positive for headaches.  Hematological: Does not bruise/bleed easily.  Psychiatric/Behavioral: Negative for dysphoric mood. The patient is not nervous/anxious.        Objective:   Physical  Exam Constitutional:  Obese female, no acute distress  HENT:  Nares patent without discharge, but deviated septum to right with turbinate hypertrophy  Oropharynx without exudate, palate and uvula are significantly elongated.  Very large tonsils.   Eyes:  Perrla, eomi, no scleral icterus  Neck:  No JVD, no TMG  Cardiovascular:  Normal rate, regular rhythm, no rubs or gallops.  No murmurs        Intact distal pulses  Pulmonary :  Normal breath sounds, no stridor or respiratory distress   No rales, rhonchi, or wheezing  Abdominal:  Soft, nondistended, bowel sounds present.  No tenderness noted.   Musculoskeletal:  No lower extremity edema noted.  Lymph Nodes:  No cervical lymphadenopathy noted  Skin:  No cyanosis noted  Neurologic:  Alert, appropriate, moves all 4 extremities without obvious deficit.         Assessment & Plan:

## 2010-09-15 NOTE — Assessment & Plan Note (Signed)
The pt's history is very suggestive of significant osa.  I have had a long discussion with the pt about sleep apnea, including its impact on QOL and CV health.  I think she needs a sleep study for diagnosis, and have also counseled her on the importance of weight loss.  Will arrange f/u once sleep study done.

## 2010-09-15 NOTE — Patient Instructions (Signed)
Will set up for sleep study to evaluate for sleep apnea Work on weight loss Will arrange followup with me once sleep study results available.

## 2010-09-20 DIAGNOSIS — G4733 Obstructive sleep apnea (adult) (pediatric): Secondary | ICD-10-CM

## 2010-09-21 NOTE — Procedures (Signed)
NAME:  Erica Tate, Erica Tate              ACCOUNT NO.:  [id number]  MEDICAL RECORD NO.:  [id number]          PATIENT TYPE:  OUT  LOCATION:  SLEEP CENTER                 FACILITY:  [facility]  PHYSICIAN:  Barbaraann Share, MD,FCCPDATE OF BIRTH:  November 24, 1977  DATE OF STUDY:  09/15/2010                           NOCTURNAL POLYSOMNOGRAM  REFERRING PHYSICIAN:  Barbaraann Share, MD,FCCP  INDICATION FOR STUDY:  Hypersomnia with sleep apnea.  EPWORTH SLEEPINESS SCORE:  20.  MEDICATIONS:  SLEEP ARCHITECTURE:  The patient had a total sleep time of 411 minutes with no slow wave sleep and adequate quantity of REM at 123 minutes. Sleep onset latency was normal at 17 minutes, and REM onset was normal at 47 minutes.  Sleep efficiency was good at 94%.  RESPIRATORY DATA:  The patient was found to have 5 apneas and 93 obstructive hypopneas, giving her an apnea-hypopnea index of 14 events per hour.  The events occurred primarily during REM and also in the supine position.  There was moderate snoring noted throughout.  OXYGEN DATA:  There was O2 desaturation as low as 78% with the patient's obstructive events.  CARDIAC DATA:  No clinically significant arrhythmias were noted.  MOVEMENT-PARASOMNIA:  The patient had no significant leg jerks or other abnormal behaviors seen.  IMPRESSIONS-RECOMMENDATIONS:  Mild obstructive sleep apnea/hypopnea syndrome with an AHI of 14 events per hour and oxygen desaturation as low as 78%.  Treatment for this degree of sleep apnea can include a trial of weight loss alone, upper airway surgery, dental appliance, and also CPAP.  Clinical correlation is suggested.     Barbaraann Share, MD,FCCP Diplomate, American Board of Sleep Medicine Electronically Signed    [facility]  D:  09/20/2010 13:58:44  T:  09/21/2010 02:57:51  Job:  [id number]

## 2010-09-22 ENCOUNTER — Telehealth: Payer: Self-pay | Admitting: *Deleted

## 2010-09-22 ENCOUNTER — Encounter (HOSPITAL_BASED_OUTPATIENT_CLINIC_OR_DEPARTMENT_OTHER): Payer: Medicaid Other

## 2010-09-22 NOTE — Telephone Encounter (Signed)
[id number]. Pt needs ov with KC to discuss sleep study results.

## 2010-09-25 NOTE — Telephone Encounter (Signed)
Patient called back.  Made appt w/KC on 7/2 at 9:30 to discuss sleep results per phone note.

## 2010-09-28 ENCOUNTER — Encounter: Payer: Self-pay | Admitting: Pulmonary Disease

## 2010-09-28 ENCOUNTER — Ambulatory Visit (INDEPENDENT_AMBULATORY_CARE_PROVIDER_SITE_OTHER): Payer: Medicare Other | Admitting: Pulmonary Disease

## 2010-09-28 VITALS — BP 124/78 | HR 96 | Temp 98.1°F | Ht 64.0 in | Wt 268.0 lb

## 2010-09-28 DIAGNOSIS — G4733 Obstructive sleep apnea (adult) (pediatric): Secondary | ICD-10-CM

## 2010-09-28 NOTE — Assessment & Plan Note (Signed)
The pt has mild osa by her sleep study, but is primarily REM related, and therefore can have a greater impact on her QOL.  I have discussed with her a trial of weight loss alone vs surgery/cpap while working on weight loss.  I do not think a dental appliance is best given her significant tonsilar hypertrophy and soft tissue narrowing posteriorly.  She would like to start with cpap while working on weight loss, and see how things go.  I will set the patient up on cpap at a moderate pressure level to allow for desensitization, and will troubleshoot the device over the next 4-6weeks if needed.  The pt is to call me if having issues with tolerance.  Will then optimize the pressure once patient is able to wear cpap on a consistent basis.

## 2010-09-28 NOTE — Progress Notes (Signed)
  Subjective:    Patient ID: Erica Tate, female    DOB: [birth year], 35 y.o.   MRN: [id number]  HPI The pt comes in today for discussion of her recent sleep study.  She was found to have mild osa with AHI 14/hr, but the majority of her events occurred during REM.  I have reviewed the study with her in detail, and answered all of her questions.    Review of Systems  Constitutional: Negative for fever and unexpected weight change.  HENT: Negative for ear pain, nosebleeds, congestion, sore throat, rhinorrhea, sneezing, trouble swallowing, dental problem, postnasal drip and sinus pressure.   Eyes: Negative for redness and itching.  Respiratory: Negative for cough, chest tightness, shortness of breath and wheezing.   Cardiovascular: Positive for palpitations. Negative for leg swelling.  Gastrointestinal: Negative for nausea and vomiting.  Genitourinary: Negative for dysuria.  Musculoskeletal: Negative for joint swelling.  Skin: Negative for rash.  Neurological: Positive for headaches.  Hematological: Does not bruise/bleed easily.  Psychiatric/Behavioral: Negative for dysphoric mood. The patient is not nervous/anxious.        Objective:   Physical Exam Obese female in nad Nares without discharge or obstruction LE without edema or cyanosis Appears sleepy, but appropriate, moves all 4        Assessment & Plan:

## 2010-09-28 NOTE — Patient Instructions (Signed)
Will start on cpap at moderate pressure level, and let's see how you do.  Please call if having tolerance issues. Work on weight loss followup with me in 5 weeks.

## 2010-10-05 ENCOUNTER — Encounter: Payer: Self-pay | Admitting: Pulmonary Disease

## 2010-10-23 ENCOUNTER — Ambulatory Visit: Payer: Medicaid Other | Admitting: Internal Medicine

## 2010-11-02 ENCOUNTER — Ambulatory Visit: Payer: Medicare Other | Admitting: Pulmonary Disease

## 2010-11-05 ENCOUNTER — Ambulatory Visit: Payer: Medicaid Other | Admitting: Internal Medicine

## 2010-12-22 LAB — COMPREHENSIVE METABOLIC PANEL
AST: 22
Albumin: 3.7
Calcium: 9
Chloride: 107
Creatinine, Ser: 0.89
GFR calc Af Amer: >60
Sodium: 138

## 2010-12-22 LAB — CK TOTAL AND CKMB (NOT AT ARMC)
Relative Index: 0.7
Relative Index: INVALID
Relative Index: INVALID
Total CK: 121
Total CK: 97
Total CK: 97

## 2010-12-22 LAB — LIPID PANEL
Cholesterol: 169
HDL: 46
Total CHOL/HDL Ratio: 3.7

## 2010-12-22 LAB — TROPONIN I
Troponin I: 0.03
Troponin I: 0.05

## 2010-12-22 LAB — DIFFERENTIAL
Eosinophils Relative: 0
Lymphocytes Relative: 6 — ABNORMAL LOW
Lymphs Abs: 0.7
Monocytes Absolute: 0.1

## 2010-12-22 LAB — URINALYSIS, ROUTINE W REFLEX MICROSCOPIC
Protein, ur: NEGATIVE
Urobilinogen, UA: 0.2

## 2010-12-22 LAB — CBC
HCT: 37.2
Platelets: 205
RBC: 4.8
WBC: 12.1 — ABNORMAL HIGH

## 2010-12-22 LAB — PREGNANCY, URINE: Preg Test, Ur: NEGATIVE

## 2010-12-22 LAB — TSH: TSH: 0.493

## 2010-12-23 LAB — DIFFERENTIAL
Basophils Relative: 1
Eosinophils Absolute: 0.1
Neutrophils Relative %: 66

## 2010-12-23 LAB — POCT I-STAT, CHEM 8
BUN: 9
Calcium, Ion: 1.13
Creatinine, Ser: 1.1
TCO2: 22

## 2010-12-23 LAB — CBC
MCHC: 33.8
MCV: 77.9 — ABNORMAL LOW
Platelets: 260
RDW: 17.2 — ABNORMAL HIGH

## 2010-12-23 LAB — URINALYSIS, ROUTINE W REFLEX MICROSCOPIC
Glucose, UA: NEGATIVE
Ketones, ur: NEGATIVE
Nitrite: NEGATIVE
Protein, ur: NEGATIVE

## 2010-12-23 LAB — PHENYTOIN LEVEL, TOTAL: Phenytoin Lvl: <2.5 — ABNORMAL LOW

## 2011-01-11 LAB — BLOOD GAS, ARTERIAL
FIO2: 21
O2 Saturation: 99.4
pCO2 arterial: 29 — ABNORMAL LOW
pO2, Arterial: 169 — ABNORMAL HIGH

## 2011-01-11 LAB — B-NATRIURETIC PEPTIDE (CONVERTED LAB): Pro B Natriuretic peptide (BNP): <30

## 2013-10-31 ENCOUNTER — Telehealth: Payer: Self-pay | Admitting: Pulmonary Disease

## 2013-10-31 NOTE — Telephone Encounter (Signed)
[id number] 

## 2013-11-01 NOTE — Telephone Encounter (Signed)
I spoke with the pt and [place] to take to her new doctor. I transferred her to medical records. Carron CurieJennifer Lalita Ebel, CMA
